# Patient Record
Sex: Female | Born: 1950 | Race: White | Hispanic: No | Marital: Married | Smoking: Former smoker
Health system: Southern US, Community
[De-identification: ages and names within clinical notes are randomized; demographics above are authoritative.]

## PROBLEM LIST (undated history)

## (undated) DIAGNOSIS — R490 Dysphonia: Secondary | ICD-10-CM

## (undated) DIAGNOSIS — I219 Acute myocardial infarction, unspecified: Secondary | ICD-10-CM

## (undated) DIAGNOSIS — I2510 Atherosclerotic heart disease of native coronary artery without angina pectoris: Secondary | ICD-10-CM

## (undated) DIAGNOSIS — M810 Age-related osteoporosis without current pathological fracture: Secondary | ICD-10-CM

## (undated) DIAGNOSIS — E782 Mixed hyperlipidemia: Secondary | ICD-10-CM

## (undated) DIAGNOSIS — J45909 Unspecified asthma, uncomplicated: Secondary | ICD-10-CM

## (undated) DIAGNOSIS — G709 Myoneural disorder, unspecified: Secondary | ICD-10-CM

## (undated) DIAGNOSIS — K219 Gastro-esophageal reflux disease without esophagitis: Secondary | ICD-10-CM

## (undated) DIAGNOSIS — G35 Multiple sclerosis: Secondary | ICD-10-CM

## (undated) DIAGNOSIS — I871 Compression of vein: Secondary | ICD-10-CM

## (undated) DIAGNOSIS — J449 Chronic obstructive pulmonary disease, unspecified: Secondary | ICD-10-CM

## (undated) DIAGNOSIS — M1990 Unspecified osteoarthritis, unspecified site: Secondary | ICD-10-CM

## (undated) DIAGNOSIS — E119 Type 2 diabetes mellitus without complications: Secondary | ICD-10-CM

## (undated) HISTORY — DX: Chronic obstructive pulmonary disease, unspecified: J44.9

## (undated) HISTORY — DX: Dysphonia: R49.0

## (undated) HISTORY — PX: BACK SURGERY: SHX140

## (undated) HISTORY — DX: Gastro-esophageal reflux disease without esophagitis: K21.9

## (undated) HISTORY — DX: Compression of vein: I87.1

## (undated) HISTORY — DX: Atherosclerotic heart disease of native coronary artery without angina pectoris: I25.10

## (undated) HISTORY — PX: TONSILLECTOMY: SUR1361

## (undated) HISTORY — PX: WRIST SURGERY: SHX841

## (undated) HISTORY — DX: Type 2 diabetes mellitus without complications: E11.9

## (undated) HISTORY — PX: FRACTURE SURGERY: SHX138

## (undated) HISTORY — DX: Acute myocardial infarction, unspecified: I21.9

## (undated) HISTORY — DX: Multiple sclerosis: G35

## (undated) HISTORY — DX: Mixed hyperlipidemia: E78.2

## (undated) HISTORY — PX: CHOLECYSTECTOMY: SHX55

## (undated) HISTORY — PX: ABDOMINAL HYSTERECTOMY: SHX81

## (undated) HISTORY — PX: CORONARY ANGIOPLASTY: SHX604

---

## 1999-06-15 ENCOUNTER — Inpatient Hospital Stay (HOSPITAL_COMMUNITY): Admission: EM | Admit: 1999-06-15 | Discharge: 1999-06-16 | Payer: Self-pay | Admitting: Cardiology

## 1999-07-01 ENCOUNTER — Encounter: Payer: Self-pay | Admitting: Oral Surgery

## 1999-07-08 ENCOUNTER — Ambulatory Visit (HOSPITAL_COMMUNITY): Admission: RE | Admit: 1999-07-08 | Discharge: 1999-07-08 | Payer: Self-pay | Admitting: Oral Surgery

## 2000-08-27 ENCOUNTER — Encounter: Admission: RE | Admit: 2000-08-27 | Discharge: 2000-08-27 | Payer: Self-pay

## 2001-01-13 ENCOUNTER — Other Ambulatory Visit: Admission: RE | Admit: 2001-01-13 | Discharge: 2001-01-13 | Payer: Self-pay | Admitting: Otolaryngology

## 2005-08-10 HISTORY — PX: COLONOSCOPY: SHX174

## 2006-08-10 HISTORY — PX: ESOPHAGOGASTRODUODENOSCOPY: SHX1529

## 2007-06-24 ENCOUNTER — Ambulatory Visit: Payer: Self-pay | Admitting: Internal Medicine

## 2007-06-30 ENCOUNTER — Ambulatory Visit (HOSPITAL_COMMUNITY): Admission: RE | Admit: 2007-06-30 | Discharge: 2007-06-30 | Payer: Self-pay | Admitting: Internal Medicine

## 2007-06-30 ENCOUNTER — Ambulatory Visit: Payer: Self-pay | Admitting: Internal Medicine

## 2007-07-05 ENCOUNTER — Ambulatory Visit (HOSPITAL_COMMUNITY): Admission: RE | Admit: 2007-07-05 | Discharge: 2007-07-05 | Payer: Self-pay | Admitting: Internal Medicine

## 2007-08-19 ENCOUNTER — Ambulatory Visit: Payer: Self-pay | Admitting: Internal Medicine

## 2007-12-17 ENCOUNTER — Ambulatory Visit: Payer: Self-pay | Admitting: *Deleted

## 2007-12-17 ENCOUNTER — Inpatient Hospital Stay (HOSPITAL_COMMUNITY): Admission: AD | Admit: 2007-12-17 | Discharge: 2007-12-22 | Payer: Self-pay | Admitting: Internal Medicine

## 2007-12-17 ENCOUNTER — Encounter: Payer: Self-pay | Admitting: Cardiology

## 2007-12-22 ENCOUNTER — Ambulatory Visit: Payer: Self-pay | Admitting: Vascular Surgery

## 2007-12-22 ENCOUNTER — Encounter: Payer: Self-pay | Admitting: Cardiology

## 2008-01-12 ENCOUNTER — Ambulatory Visit: Payer: Self-pay | Admitting: Cardiology

## 2008-04-10 ENCOUNTER — Encounter: Payer: Self-pay | Admitting: Cardiology

## 2008-05-11 ENCOUNTER — Encounter: Payer: Self-pay | Admitting: Cardiology

## 2009-04-29 DIAGNOSIS — E785 Hyperlipidemia, unspecified: Secondary | ICD-10-CM

## 2009-05-20 ENCOUNTER — Ambulatory Visit: Payer: Self-pay | Admitting: Cardiology

## 2009-05-20 ENCOUNTER — Encounter: Payer: Self-pay | Admitting: Physician Assistant

## 2009-05-20 DIAGNOSIS — R0789 Other chest pain: Secondary | ICD-10-CM | POA: Insufficient documentation

## 2009-05-20 DIAGNOSIS — I2510 Atherosclerotic heart disease of native coronary artery without angina pectoris: Secondary | ICD-10-CM

## 2009-05-20 DIAGNOSIS — R0989 Other specified symptoms and signs involving the circulatory and respiratory systems: Secondary | ICD-10-CM

## 2009-05-20 DIAGNOSIS — I739 Peripheral vascular disease, unspecified: Secondary | ICD-10-CM

## 2009-05-20 DIAGNOSIS — F17200 Nicotine dependence, unspecified, uncomplicated: Secondary | ICD-10-CM | POA: Insufficient documentation

## 2009-05-22 ENCOUNTER — Encounter: Payer: Self-pay | Admitting: Physician Assistant

## 2009-05-22 ENCOUNTER — Ambulatory Visit: Payer: Self-pay | Admitting: Cardiology

## 2009-06-25 ENCOUNTER — Ambulatory Visit: Payer: Self-pay | Admitting: Cardiology

## 2009-06-25 DIAGNOSIS — G458 Other transient cerebral ischemic attacks and related syndromes: Secondary | ICD-10-CM

## 2009-06-28 ENCOUNTER — Encounter: Payer: Self-pay | Admitting: Cardiology

## 2009-07-09 ENCOUNTER — Ambulatory Visit: Payer: Self-pay | Admitting: Cardiovascular Disease

## 2009-07-10 ENCOUNTER — Encounter: Payer: Self-pay | Admitting: Cardiology

## 2009-07-24 ENCOUNTER — Encounter: Payer: Self-pay | Admitting: Cardiology

## 2009-07-26 ENCOUNTER — Encounter (INDEPENDENT_AMBULATORY_CARE_PROVIDER_SITE_OTHER): Payer: Self-pay

## 2009-07-26 ENCOUNTER — Telehealth: Payer: Self-pay | Admitting: Cardiovascular Disease

## 2009-07-26 ENCOUNTER — Encounter: Payer: Self-pay | Admitting: Cardiovascular Disease

## 2009-08-01 ENCOUNTER — Ambulatory Visit (HOSPITAL_COMMUNITY): Admission: RE | Admit: 2009-08-01 | Discharge: 2009-08-01 | Payer: Self-pay | Admitting: Cardiovascular Disease

## 2009-08-01 ENCOUNTER — Ambulatory Visit: Payer: Self-pay | Admitting: Cardiovascular Disease

## 2009-09-03 ENCOUNTER — Ambulatory Visit: Payer: Self-pay | Admitting: Cardiovascular Disease

## 2009-09-03 ENCOUNTER — Other Ambulatory Visit: Admission: RE | Admit: 2009-09-03 | Discharge: 2009-09-03 | Payer: Self-pay | Admitting: Endocrinology

## 2009-10-10 ENCOUNTER — Telehealth (INDEPENDENT_AMBULATORY_CARE_PROVIDER_SITE_OTHER): Payer: Self-pay | Admitting: *Deleted

## 2009-10-29 ENCOUNTER — Ambulatory Visit: Payer: Self-pay | Admitting: Cardiology

## 2009-10-29 DIAGNOSIS — G4700 Insomnia, unspecified: Secondary | ICD-10-CM | POA: Insufficient documentation

## 2010-03-20 ENCOUNTER — Ambulatory Visit: Payer: Self-pay

## 2010-03-20 ENCOUNTER — Ambulatory Visit: Payer: Self-pay | Admitting: Cardiovascular Disease

## 2010-05-08 ENCOUNTER — Ambulatory Visit: Payer: Self-pay | Admitting: Cardiology

## 2010-05-13 ENCOUNTER — Encounter: Payer: Self-pay | Admitting: Cardiology

## 2010-09-10 HISTORY — PX: CORONARY ARTERY BYPASS GRAFT: SHX141

## 2010-09-11 NOTE — Assessment & Plan Note (Signed)
Summary: ROV   Visit Type:  Follow-up Primary Provider:  Kirstie Peri, MD   History of Present Illness: 60 year-old woman with CAD and left subclavian stenosis presenting for follow-up evaluation. She presented in December 2010 with left arm claudication and was found to have severe left subclavian stenosis by ultrasound and CTA. She underwent angio and stenting on December 23rd.   She has noticed increased strength and less feeling of coldness in the left arm. She denies dizziness, lightheadedness, syncope, or arm claudication at present.    Current Medications (verified): 1)  Metoprolol Tartrate 25 Mg Tabs (Metoprolol Tartrate) .... Take 1/2 Tablet By Mouth Twice A Day 2)  Plavix 75 Mg Tabs (Clopidogrel Bisulfate) .... Take 1 Tablet By Mouth Once A Day 3)  Simvastatin 20 Mg Tabs (Simvastatin) .... Take 1 Tablet By Mouth At Bedtime 4)  Wellbutrin Sr 150 Mg Xr12h-Tab (Bupropion Hcl) .... Take 1 Tablet By Mouth Two Times A Day 5)  Gabapentin 600 Mg Tabs (Gabapentin) .... Take 1 Tablet By Mouth Two Times A Day 6)  Aspirin 81 Mg Tbec (Aspirin) .... Take One Tablet By Mouth Daily 7)  Norvasc 5 Mg Tabs (Amlodipine Besylate) .... Take 1 Tablet By Mouth Once A Day 8)  Isosorbide Mononitrate Cr 30 Mg Xr24h-Tab (Isosorbide Mononitrate) .... Take 1 Tablet By Mouth Once A Day 9)  Trazodone Hcl 50 Mg Tabs (Trazodone Hcl) .... As Needed  Allergies: 1)  Ambien (Zolpidem Tartrate) 2)  Morphine Sulfate (Morphine Sulfate)  Past History:  Past medical history reviewed for relevance to current acute and chronic problems.  Past Medical History: HYPERLIPIDEMIA-MIXED (ICD-272.4) Hx of MI Multiple sclerosis.  Hoarseness, chronic.  Diet-controlled diabetes.  Dyslipidemia.  Intolerant of high-dose Lipitor.  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  History of gastroesophageal reflux disease. PAD with left subclavian stenosis and subclavian steal s/p subclavian stenting  Vital  Signs:  Patient profile:   60 year old female Height:      62 inches Weight:      110.25 pounds BMI:     20.24 Pulse rate:   64 / minute Pulse rhythm:   regular Resp:     18 per minute BP sitting:   94 / 60  (left arm) Cuff size:   regular  Vitals Entered By: Vikki Ports (March 20, 2010 11:03 AM)  Physical Exam  General:  Pt is alert and oriented, in no acute distress. HEENT: normal Neck: normal carotid upstrokes without bruits, JVP normal Lungs: CTA CV: RRR without murmur or gallop Abd: soft, NT, positive BS, no bruit, no organomegaly Ext: no clubbing, cyanosis, or edema. 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 Left arm arterial waveforms are brisk and triphasic. Carotid duplex reviewed and shows plaque that is not hemodynamically significant and vertebral flow is antegrade bilaterally. Continue curent therapy and f/u in one year.  Her updated medication list for this problem includes:    Plavix 75 Mg Tabs (Clopidogrel bisulfate) .Marland Kitchen... Take 1 tablet by mouth once a day    Aspirin 81 Mg Tbec (Aspirin) .Marland Kitchen... Take one tablet by mouth daily  Patient Instructions: 1)  Your physician recommends that you continue on your current medications as directed. Please refer to the Current Medication list given to you today. 2)  Your physician wants you to follow-up in: 1 YEAR.  You will receive a  reminder letter in the mail two months in advance. If you don't receive a letter, please call our office to schedule the follow-up appointment. 3)  Your physician has requested that you have a  upper extremity arterial duplex in 1 YEAR.  This test is an ultrasound of the arteries in the arms.  It looks at arterial blood flow in the arms.  Allow one hour for Upper Arterial scans. There are no restrictions or special instructions.

## 2010-09-11 NOTE — Cardiovascular Report (Signed)
Summary: Pre Op Orders  Pre Op Orders   Imported By: Roderic Ovens 08/26/2009 11:08:49  _____________________________________________________________________  External Attachment:    Type:   Image     Comment:   External Document

## 2010-09-11 NOTE — Assessment & Plan Note (Signed)
Summary: eval. chest pain  --agh   Visit Type:  Follow-up Primary Provider:  Kirstie Peri, MD  CC:  follow-up visit.  History of Present Illness: the patient is a 60 year old female with history of herniorrhaphies and prior myocardial infarction. She recently underwent stenting for symptomatic left subclavian artery stenosis. The patient currently denies exertional chest pain. She reports orthopnea PND. A stress test last year which was negative for ischemia. 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 She also has peripheral vascular disease with 50-70% bilateral carotid artery stenosis.  The patient under significant stress. She feels very depressed. She has a difficult social situation and is trying to get custody of her grandchildren. She has severe insomnia.  The patient denies any claudication. She reports no orthopnea or PND. She reports no palpitations.  Preventive Screening-Counseling & Management  Alcohol-Tobacco     Smoking Status: current     Smoking Cessation Counseling: yes     Packs/Day: 1/2 ppd  Current Medications (verified): 1)  Metoprolol Tartrate 25 Mg Tabs (Metoprolol Tartrate) .... Take 1/2 Tablet By Mouth Twice A Day 2)  Plavix 75 Mg Tabs (Clopidogrel Bisulfate) .... Take 1 Tablet By Mouth Once A Day 3)  Simvastatin 20 Mg Tabs (Simvastatin) .... Take 1 Tablet By Mouth At Bedtime 4)  Wellbutrin Sr 150 Mg Xr12h-Tab (Bupropion Hcl) .... Take 1 Tablet By Mouth Once A Day 5)  Gabapentin 600 Mg Tabs (Gabapentin) .... Take 1 Tablet By Mouth Two Times A Day 6)  Aspirin 81 Mg Tbec (Aspirin) .... Take One Tablet By Mouth Daily 7)  Norvasc 5 Mg Tabs (Amlodipine Besylate) .... Take 1 Tablet By Mouth Once A Day 8)  Isosorbide Mononitrate Cr 30 Mg Xr24h-Tab (Isosorbide Mononitrate) .... Take 1 Tablet By Mouth Once A Day 9)  Trazodone Hcl 50 Mg Tabs (Trazodone Hcl) .... Take 1 Tab By Mouth At Bedtime  Allergies (verified): 1)  Ambien (Zolpidem  Tartrate) 2)  Morphine Sulfate (Morphine Sulfate)  Comments:  Nurse/Medical Assistant: The patient's medications and allergies were reviewed with the patient and were updated in the Medication and Allergy Lists. List reviewed.  Past History:  Past Medical History: Last updated: 08/08/09 HYPERLIPIDEMIA-MIXED (ICD-272.4) Hx of MI Multiple sclerosis.  Hoarseness, chronic.  Diet-controlled diabetes.  Dyslipidemia.  Intolerant of high-dose Lipitor.  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  History of gastroesophageal reflux disease. 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  Past Surgical History: Last updated: 04/29/2009 Cholecystectomy  Family History: Last updated: 2009/08/08 Mother died at age 20 from cancer, father died at age 23 from cancer, sister died at age 53 from "blood poisoning"  No premature CAD or aneurysm in the family.  Social History: Last updated: 08/08/09 Disabled  Married with 2 children Tobacco Use - Yes.  Alcohol Use - yes Drug Use - no  Risk Factors: Smoking Status: current (10/29/2009) Packs/Day: 1/2 ppd (10/29/2009)  Social History: Packs/Day:  1/2 ppd  Review of Systems       The patient complains of depression.  The patient denies fatigue, malaise, fever, weight gain/loss, vision loss, decreased hearing, hoarseness, chest pain, palpitations, shortness of breath, prolonged cough, wheezing, sleep apnea, coughing up blood, abdominal pain, blood in stool, nausea, vomiting, diarrhea, heartburn, incontinence, blood in urine, muscle weakness, joint pain, leg swelling, rash, skin lesions, headache, fainting, dizziness, anxiety, enlarged lymph nodes, easy bruising or bleeding, and environmental allergies.         insomnia  Vital Signs:  Patient profile:  60 year old female Height:      62 inches Weight:      113 pounds Pulse rate:   73 / minute BP sitting:   129 / 84  (left arm) Cuff size:    regular  Vitals Entered By: Carlye Grippe (October 29, 2009 1:17 PM) CC: follow-up visit   Physical Exam  Additional Exam:  General: Well-developed, well-nourished in no distress head: Normocephalic and atraumatic eyes PERRLA/EOMI intact, conjunctiva and lids normal nose: No deformity or lesions mouth normal dentition, normal posterior pharynx neck: Supple, no JVD.  No masses, thyromegaly or abnormal cervical nodes lungs: Normal breath sounds bilaterally without wheezing.  Normal percussion heart: regular rate and rhythm with normal S1 and S2, no S3 or S4.  PMI is normal.  No pathological murmurs abdomen: Normal bowel sounds, abdomen is soft and nontender without masses, organomegaly or hernias noted.  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 She reports no dizziness. She has an increased sensation of warmth in her left arm. She has a good distal pulse. Her updated medication list for this problem includes:    Plavix 75 Mg Tabs (Clopidogrel bisulfate) .Marland Kitchen... Take 1 tablet by mouth once a day    Aspirin 81 Mg Tbec (Aspirin) .Marland Kitchen... Take one tablet by mouth daily  Problem # 2:  CORONARY ATHEROSCLEROSIS NATIVE CORONARY ARTERY (ICD-414.01) the patient denies exertional chest pain. Chest chest is last year which showed no ischemia. She can continue her current medical regimen. Her updated medication list for this problem includes:    Metoprolol Tartrate 25 Mg Tabs (Metoprolol tartrate) .Marland Kitchen... Take 1/2 tablet by mouth twice a day    Plavix 75 Mg Tabs  (Clopidogrel bisulfate) .Marland Kitchen... Take 1 tablet by mouth once a day    Aspirin 81 Mg Tbec (Aspirin) .Marland Kitchen... Take one tablet by mouth daily    Norvasc 5 Mg Tabs (Amlodipine besylate) .Marland Kitchen... Take 1 tablet by mouth once a day    Isosorbide Mononitrate Cr 30 Mg Xr24h-tab (Isosorbide mononitrate) .Marland Kitchen... Take 1 tablet by mouth once a day  Problem # 3:  PERIPHERAL VASCULAR DISEASE (ICD-443.9) the patient has bilateral carotid artery stenosis rated at 50-70%. She will be scheduled for followup Doppler to her next clinic visit  Problem # 4:  TOBACCO USER (ICD-305.1) the patient was counseled regarding tobacco use.  Problem # 5:  INSOMNIA (ICD-780.52) the patient has insomnia likely related to her depression. I asked her to cut back a little bit on Wellbutrin 150 mg in the morning and started her on trazodone in the evening at 50 mg p.o. q.h.s. This can be further increased to 75 mg if needed.  Other Orders: EKG w/ Interpretation (93000)  Patient Instructions: 1)  Trazodone 50mg  at bedtime  2)  Stop Benadryl at bedtime 3)  Plavix 75mg  - #12 tabs given (samples)  Lot [id number]  Exp. 10/2010 4)  Follow up in  6 months. Prescriptions: GABAPENTIN 600 MG TABS (GABAPENTIN) Take 1 tablet by mouth two times a day  #60 x 0   Entered by:   Hoover Brunette, LPN   Authorized by:  Lewayne Bunting, MD, [facility] [place]   Signed by:   Hoover Brunette, LPN on 16/05/9603   Method used:   Electronically to        [facility] [address] (retail)       [address]       [place], [place]  [address]       Ph: [phone] or [phone]       Fax: [phone]   RxID:   [id number] NORVASC 5 MG TABS (AMLODIPINE BESYLATE) Take 1 tablet by mouth once a day  #90 x 3   Entered by:   Hoover Brunette, LPN   Authorized by:   Lewayne Bunting, MD, [facility]   Signed by:   Hoover Brunette, LPN on 40/34/7425   Method used:   Electronically to        [facility] (mail-order)             ,          Ph: [phone]       Fax:  [phone]   RxID:   [id number] SIMVASTATIN 20 MG TABS (SIMVASTATIN) Take 1 tablet by mouth at bedtime  #90 x 3   Entered by:   Hoover Brunette, LPN   Authorized by:   Lewayne Bunting, MD, [facility]   Signed by:   Hoover Brunette, LPN on 73/22/0254   Method used:   Electronically to        [facility] (mail-order)             ,          Ph: [phone]       Fax: [phone]   RxID:   [id number] PLAVIX 75 MG TABS (CLOPIDOGREL BISULFATE) Take 1 tablet by mouth once a day  #90 x 3   Entered by:   Hoover Brunette, LPN   Authorized by:   Lewayne Bunting, MD, [facility]   Signed by:   Hoover Brunette, LPN on 35/00/9381   Method used:   Electronically to        [facility] (mail-order)             ,          Ph: [phone]       Fax: [phone]   RxID:   [id number] METOPROLOL TARTRATE 25 MG TABS (METOPROLOL TARTRATE) Take 1/2 tablet by mouth twice a day  #90 x 3   Entered by:   Hoover Brunette, LPN   Authorized by:   Lewayne Bunting, MD, [facility]   Signed by:   Hoover Brunette, LPN on 61/44/3154   Method used:   Electronically to        [facility] (mail-order)             ,          Ph: [phone]       Fax: [phone]   RxID:   [id number] WELLBUTRIN SR 150 MG XR12H-TAB (BUPROPION HCL) Take 1 tablet by mouth once a day  #30 x 0   Entered by:   Hoover Brunette, LPN   Authorized by:   Lewayne Bunting, MD, [facility]   Signed by:   Hoover Brunette, LPN on 34/19/3790   Method used:   Electronically to        [facility] [address] (retail)       [address]       [place]  [place], [place]  [address]       Ph: [phone] or [phone]       Fax: [phone]   RxID:   [id number] TRAZODONE HCL 50 MG TABS (TRAZODONE HCL) Take 1 tab by mouth at bedtime  #30 x 2   Entered by:   Hoover Brunette, LPN   Authorized by:   Lewayne Bunting, MD, [facility]   Signed by:   Hoover Brunette, LPN on 27/25/3664   Method used:   Electronically to        [facility] [address] (retail)       [address]       [place], [place]  [address]       Ph: [phone] or [phone]       Fax: [phone]   RxID:   [id number]   Handout requested.

## 2010-09-11 NOTE — Assessment & Plan Note (Signed)
Summary: 6 mo fu per sept reminder-srs   Visit Type:  Follow-up Primary Provider:  Kirstie Peri, MD   History of Present Illness: the patient is a 60 year old female with history of peripheral vascular disease status post stent placement of left subclavian artery. Prior Doppler demonstrated significant carotid disease but a repeat study in our vascular lab at [facility] showed only 0-39% bilateral stenosis. The patient continues on dual antiplatelet therapy. She also has a multinodular goiter which is being evaluated. She has increased her exercise level but unfortunately continues to smoke.  She denies any chest pain. She has no orthopnea PND. Her insomnia has improved. She reports no dizziness or syncope.  Preventive Screening-Counseling & Management  Alcohol-Tobacco     Smoking Status: current     Smoking Cessation Counseling: yes     Packs/Day: 1/2 PPD  Current Medications (verified): 1)  Metoprolol Tartrate 25 Mg Tabs (Metoprolol Tartrate) .... Take 1/2 Tablet By Mouth Twice A Day 2)  Plavix 75 Mg Tabs (Clopidogrel Bisulfate) .... Take 1 Tablet By Mouth Once A Day 3)  Simvastatin 20 Mg Tabs (Simvastatin) .... Take 1 Tablet By Mouth At Bedtime 4)  Wellbutrin Sr 150 Mg Xr12h-Tab (Bupropion Hcl) .... Take 1 Tablet By Mouth Two Times A Day 5)  Gabapentin 600 Mg Tabs (Gabapentin) .... Take 1 Tablet By Mouth Two Times A Day 6)  Aspirin 81 Mg Tbec (Aspirin) .... Take One Tablet By Mouth Daily 7)  Norvasc 5 Mg Tabs (Amlodipine Besylate) .... Take 1 Tablet By Mouth Once A Day 8)  Isosorbide Mononitrate Cr 30 Mg Xr24h-Tab (Isosorbide Mononitrate) .... Take 1 Tablet By Mouth Once A Day 9)  Trazodone Hcl 50 Mg Tabs (Trazodone Hcl) .... As Needed 10)  Nitrostat 0.4 Mg Subl (Nitroglycerin) .... Place 1 Under Tongue For Chest Pain Every 5 Min Up To 3 Doses,if No Relief,proceed To Ed 11)  Prilosec Otc 20 Mg Tbec (Omeprazole Magnesium) .... Take 1 Tablet By Mouth Two Times A Day 12)  Reclast 5  Mg/172ml Soln (Zoledronic Acid) .... One Dose Iv Yearly  Allergies (verified): 1)  Ambien (Zolpidem Tartrate) 2)  Morphine Sulfate (Morphine Sulfate)  Comments:  Nurse/Medical Assistant: The patient's medication list and allergies were reviewed with the patient and were updated in the Medication and Allergy Lists.  Past History:  Past Surgical History: Last updated: 04/29/2009 Cholecystectomy  Family History: Last updated: 2009/07/30 Mother died at age 69 from cancer, father died at age 42 from cancer, sister died at age 63 from "blood poisoning"  No premature CAD or aneurysm in the family.  Social History: Last updated: 07/30/09 Disabled  Married with 2 children Tobacco Use - Yes.  Alcohol Use - yes Drug Use - no  Risk Factors: Smoking Status: current (05/08/2010) Packs/Day: 1/2 PPD (05/08/2010)  Past Medical History: HYPERLIPIDEMIA-MIXED (ICD-272.4) Hx of MI, 2009, status post stent placement x2 Multiple sclerosis.  Hoarseness, chronic.  Diet-controlled diabetes.  Dyslipidemia.  Intolerant of high-dose Lipitor.  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  History of gastroesophageal reflux disease. 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  No masses, thyromegaly or abnormal cervical nodes, soft carotid bruits lungs: Normal breath  sounds bilaterally without wheezing.  Normal percussion heart: regular rate and rhythm with normal S1 and S2, no S3 or S4.  PMI is normal.  No pathological murmurs abdomen: Normal bowel sounds,  abdomen is soft and nontender without masses, organomegaly or hernias noted.  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 Dopplers were performed with a patent stent on March 20, 2010. Continue risk factor modification Her updated medication list for this problem includes:    Plavix 75 Mg Tabs (Clopidogrel bisulfate) .Marland Kitchen... Take 1 tablet by mouth once a day    Aspirin 81 Mg Tbec (Aspirin) .Marland Kitchen... Take one tablet by mouth daily  Problem # 2:  CORONARY ATHEROSCLEROSIS NATIVE CORONARY ARTERY (ICD-414.01) no recurrent chest pain. The patient and my 2009 and stent placement that same year no indication for stress testing currently Her updated medication list for this problem includes:    Metoprolol Tartrate 25 Mg Tabs (Metoprolol tartrate) .Marland Kitchen... Take 1/2 tablet by mouth twice a day    Plavix 75 Mg Tabs (Clopidogrel bisulfate) .Marland Kitchen... Take 1 tablet by mouth once a day    Aspirin 81 Mg Tbec (Aspirin) .Marland Kitchen... Take one tablet by mouth daily    Norvasc 5 Mg Tabs (Amlodipine besylate) .Marland Kitchen... Take 1 tablet by mouth once a day    Isosorbide Mononitrate Cr 30 Mg Xr24h-tab (Isosorbide mononitrate) .Marland Kitchen... Take 1 tablet by mouth once a day    Nitrostat 0.4 Mg Subl (Nitroglycerin) .Marland Kitchen... Place 1 under tongue for chest pain every 5 min up to 3 doses,if no relief,proceed to ed  Problem # 3:  CAROTID BRUIT (ICD-785.9) no significant carotid artery disease  Problem # 4:  HYPERLIPIDEMIA-MIXED (ICD-272.4) patient will follow up with her primary care physician Her updated medication list for this  problem includes:    Simvastatin 20 Mg Tabs (Simvastatin) .Marland Kitchen... Take 1 tablet by mouth at bedtime   Social History: Packs/Day:  1/2 PPD  Review of Systems  The patient denies fatigue, malaise, fever, weight gain/loss, vision loss, decreased hearing, hoarseness, chest pain, palpitations, shortness of breath, prolonged cough, wheezing, sleep apnea, coughing up blood, abdominal pain, blood in stool, nausea, vomiting, diarrhea, heartburn, incontinence, blood in urine, muscle weakness, joint pain, leg swelling, rash, skin lesions, headache, fainting, dizziness, depression, anxiety, enlarged lymph nodes, easy bruising or bleeding, and environmental allergies.    Vital Signs:  Patient profile:   60 year old female Height:      62 inches Weight:      111 pounds Pulse rate:   64 / minute BP sitting:   127 / 84  (left arm) Cuff size:   regular  Vitals Entered By: Carlye Grippe (May 08, 2010 10:50 AM)  Physical Exam  Additional Exam:  General: Well-developed, well-nourished in no distress head: Normocephalic and atraumatic eyes PERRLA/EOMI intact, conjunctiva and lids normal nose: No deformity or lesions mouth normal dentition, normal posterior pharynx neck: Supple, no JVD.  No masses, thyromegaly or abnormal cervical nodes, soft carotid bruits lungs: Normal breath sounds bilaterally without wheezing.  Normal percussion heart: regular rate and rhythm with normal S1 and S2, no S3 or S4.  PMI is  normal.  No pathological murmurs abdomen: Normal bowel sounds, abdomen is soft and nontender without masses, organomegaly or hernias noted.  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 Dopplers were performed with a patent stent on March 20, 2010. Continue risk factor modification Her updated medication list for this problem includes:    Plavix 75 Mg Tabs (Clopidogrel bisulfate) .Marland Kitchen... Take 1 tablet by mouth once a day    Aspirin 81 Mg Tbec (Aspirin) .Marland Kitchen... Take one tablet by mouth daily  Problem # 2:  CORONARY ATHEROSCLEROSIS NATIVE CORONARY ARTERY (ICD-414.01) no recurrent chest pain. The patient and my 2009 and stent placement that same year no indication for stress testing currently Her updated medication list for this problem includes:    Metoprolol Tartrate 25 Mg Tabs (Metoprolol tartrate) .Marland Kitchen... Take 1/2 tablet by mouth twice a day    Plavix 75 Mg Tabs (Clopidogrel bisulfate) .Marland Kitchen... Take 1 tablet by mouth once a day    Aspirin 81 Mg Tbec (Aspirin) .Marland Kitchen... Take one tablet by mouth daily    Norvasc 5 Mg Tabs (Amlodipine besylate) .Marland Kitchen... Take 1 tablet by mouth once a day    Isosorbide Mononitrate Cr 30 Mg Xr24h-tab (Isosorbide mononitrate) .Marland Kitchen... Take 1 tablet by mouth once a day    Nitrostat 0.4 Mg Subl (Nitroglycerin) .Marland Kitchen... Place 1 under tongue for chest pain every 5 min up to 3 doses,if no relief,proceed to ed  Problem # 3:  CAROTID BRUIT (ICD-785.9) no significant carotid artery disease  Problem # 4:  HYPERLIPIDEMIA-MIXED (ICD-272.4) patient will follow up with her primary care physician Her updated medication list for this problem includes:    Simvastatin 20 Mg Tabs (Simvastatin) .Marland Kitchen... Take 1 tablet by mouth at bedtime  Patient Instructions: 1)  Your physician recommends that you continue on your current medications as directed. Please refer to the Current Medication list given to you today. 2)  Follow up in  6 months Prescriptions: TRAZODONE HCL 50 MG TABS (TRAZODONE HCL) as needed  #30 x 2   Entered by:   Hoover Brunette, LPN   Authorized by:    Lewayne Bunting, MD, [facility]   Signed by:   Hoover Brunette, LPN on 19/14/7829   Method used:   Electronically to        [facility] [address] (retail)       [address]       [place], [place]  [address]       Ph: [phone] or [phone]       Fax: [phone]   RxID:   [id number] NITROSTAT 0.4 MG SUBL (NITROGLYCERIN) place 1 under tongue for chest pain every 5 min up to 3 doses,if no relief,proceed to ED  #25 x 3   Entered by:   Hoover Brunette, LPN   Authorized by:   Lewayne Bunting, MD, [facility]   Signed by:   Hoover Brunette, LPN on 56/43/3295   Method used:   Electronically to        [facility] [address] (retail)       [address]  [place], [place]  [address]       Ph: [phone] or [phone]       Fax: [phone]   RxID:   [id number]

## 2010-09-11 NOTE — Assessment & Plan Note (Signed)
Summary: rov   Visit Type:  Follow-up Primary Provider:  Kirstie Peri, MD  CC:  No complaints.  History of Present Illness: 60 year-old woman with CAD and left subclavian stenosis presenting for follow-up evaluation. She presented in December 2010 with left arm claudication and was found to have severe left subclavian stenosis by ultrasound and CTA. She underwent angio and stenting on December 23rd. The subclavian was a total occlusion, but the occluded segment was short. She reports complete resolution of left arm pain since the procedure. She had about one week of left hand swelling, which has now fully resolved. No chest pain or other complaints.  Current Medications (verified): 1)  Metoprolol Tartrate 25 Mg Tabs (Metoprolol Tartrate) .... Take 1/2 Tablet By Mouth Twice A Day 2)  Plavix 75 Mg Tabs (Clopidogrel Bisulfate) .... Take 1 Tablet By Mouth Once A Day 3)  Simvastatin 20 Mg Tabs (Simvastatin) .... Take 1 Tablet By Mouth At Bedtime 4)  Wellbutrin Sr 150 Mg Xr12h-Tab (Bupropion Hcl) .... Take 1 Tablet By Mouth Two Times A Day 5)  Gabapentin 600 Mg Tabs (Gabapentin) .... Take 1 Tablet By Mouth Two Times A Day 6)  Aspirin Ec 325 Mg Tbec (Aspirin) .... Take One Tablet By Mouth Daily 7)  Melatonin 3 Mg Tabs (Melatonin) .... Take 1 Tab By Mouth At Bedtime 8)  Darvocet-N 100 100-650 Mg Tabs (Propoxyphene N-Apap) .... As Needed Pain 9)  Norvasc 5 Mg Tabs (Amlodipine Besylate) .... Take 1 Tablet By Mouth Once A Day 10)  Benadryl 25 Mg Caps (Diphenhydramine Hcl) .... Take 1 Capsule By Mouth At Bedtime  Allergies: 1)  Ambien (Zolpidem Tartrate) 2)  Morphine Sulfate (Morphine Sulfate)  Past History:  Past medical history reviewed for relevance to current acute and chronic problems.  Past Medical History: Reviewed history from 07/09/2009 and no changes required. HYPERLIPIDEMIA-MIXED (ICD-272.4) Hx of MI Multiple sclerosis.  Hoarseness, chronic.  Diet-controlled diabetes.    Dyslipidemia.  Intolerant of high-dose Lipitor.  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  History of gastroesophageal reflux disease. 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  Vital Signs:  Patient profile:   60 year old female Height:      62 inches Weight:      115.50 pounds BMI:     21.20 Pulse rate:   72 / minute Pulse rhythm:   regular Resp:     18 per minute BP sitting:   116 / 70  (left arm)  Vitals Entered By: Vikki Ports (September 03, 2009 11:44 AM)  Serial Vital Signs/Assessments:  Time      Position  BP       Pulse  Resp  Temp     By           R Arm     106/72                         Vikki Ports   Physical Exam  General:  Pt is alert and oriented, in no acute distress. HEENT: normal Neck: normal carotid upstrokes without bruits, JVP normal Lungs: CTA CV: RRR without murmur or gallop Abd: soft, NT, positive BS, no bruit, no organomegaly Ext: no clubbing, cyanosis, or edema. 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 Brachial and radial pulses are 2+ bilaterally. Recommend f/u in 6 months  with carotid/upper extremity doppler studies at that time. She also has coronary stents, and I have recommended continuing ASA and Plavix, but reducing her ASA dose to 81 mg. She will f/u with Dr Andee Lineman for ongoing cardiac care.  Her updated medication list for this problem includes:    Plavix 75 Mg Tabs (Clopidogrel bisulfate) .Marland Kitchen... Take 1 tablet by mouth once a day    Aspirin 81 Mg Tbec (Aspirin) .Marland Kitchen... Take one tablet by mouth daily  Orders: Carotid Duplex (Carotid Duplex) Arterial Duplex Upper Extremity (Arterial Duplex Up )  Problem # 2:  CORONARY ATHEROSCLEROSIS NATIVE CORONARY ARTERY (ICD-414.01) Stable without angina. Reduce ASA to 81 mg as above.  Her updated medication list for  this problem includes:    Metoprolol Tartrate 25 Mg Tabs (Metoprolol tartrate) .Marland Kitchen... Take 1/2 tablet by mouth twice a day    Plavix 75 Mg Tabs (Clopidogrel bisulfate) .Marland Kitchen... Take 1 tablet by mouth once a day    Aspirin 81 Mg Tbec (Aspirin) .Marland Kitchen... Take one tablet by mouth daily    Norvasc 5 Mg Tabs (Amlodipine besylate) .Marland Kitchen... Take 1 tablet by mouth once a day  Orders: Carotid Duplex (Carotid Duplex) Arterial Duplex Upper Extremity (Arterial Duplex Up )  Patient Instructions: 1)  Your physician wants you to follow-up in:   6 MONTHS. You will receive a reminder letter in the mail two months in advance. If you don't receive a letter, please call our office to schedule the follow-up appointment. 2)  Your physician has requested that you have a carotid duplex in 6 MONTHS. This test is an ultrasound of the carotid arteries in your neck. It looks at blood flow through these arteries that supply the brain with blood. Allow one hour for this exam. There are no restrictions or special instructions. 3)  Your physician has requested that you have a upper extremity arterial duplex in 6 MONTHS.  This test is an ultrasound of the arteries in the arms.  It looks at arterial blood flow in the arms.  Allow one hour for Upper Arterial scans. There are no restrictions or special instructions. 4)  Your physician has recommended you make the following change in your medication: DECREASE Aspirin to 81mg  once a day

## 2010-09-11 NOTE — Progress Notes (Signed)
Summary: chest pain  Phone Note Call from Patient   Summary of Call: 9:05  Jan Irion [birth year]  [phone]  [phone]  Been having CP since Sat. off/on.  Not sure what to do.    Returned call to pt.  States that she has been under alot of stress lately and not sleeping good either.  States her chest pain has been off/on and is releived by ASA.  Has not had to take her NTG.  Does not have any other symptoms.  Scheduled appt. for 3/22.  Advised pt. to go to ED if symptoms continue/worsen.  Patient verbalized understanding.  Initial call taken by: Hoover Brunette, LPN,  October 10, 2009 3:56 PM  Follow-up for Phone Call        Would start Imdur 30mg  by mouth qdaily until she sees me in office.  Follow-up by: Lewayne Bunting, MD, [facility],  October 11, 2009 11:56 AM  Additional Follow-up for Phone Call Additional follow up Details #1::        Patient notified.    Additional Follow-up by: Hoover Brunette, LPN,  October 11, 2009 6:03 PM    New/Updated Medications: ISOSORBIDE MONONITRATE CR 30 MG XR24H-TAB (ISOSORBIDE MONONITRATE) Take 1 tablet by mouth once a day Prescriptions: ISOSORBIDE MONONITRATE CR 30 MG XR24H-TAB (ISOSORBIDE MONONITRATE) Take 1 tablet by mouth once a day  #30 x 6   Entered by:   Hoover Brunette, LPN   Authorized by:   Lewayne Bunting, MD, [facility]   Signed by:   Hoover Brunette, LPN on 30/86/5784   Method used:   Electronically to        [facility] [address] (retail)       [address]       [place], [place]  [address]       Ph: [phone] or [phone]       Fax: [phone]   RxID:   [id number]

## 2010-09-11 NOTE — Medication Information (Signed)
Summary: RX Folder/ FAXED [employer] TESTING AUTHORIZATION  RX Folder/ FAXED [employer]ING AUTHORIZATION   Imported By: Dorise Hiss 05/14/2010 12:29:21  _____________________________________________________________________  External Attachment:    Type:   Image     Comment:   External Document

## 2010-09-20 ENCOUNTER — Encounter: Payer: Self-pay | Admitting: Cardiology

## 2010-09-21 ENCOUNTER — Inpatient Hospital Stay (HOSPITAL_COMMUNITY)
Admission: RE | Admit: 2010-09-21 | Discharge: 2010-10-02 | DRG: 234 | Disposition: A | Discharge status: Home / Self Care | Payer: Medicare HMO | Source: Other Acute Inpatient Hospital | Attending: Thoracic Surgery (Cardiothoracic Vascular Surgery) | Admitting: Thoracic Surgery (Cardiothoracic Vascular Surgery)

## 2010-09-21 DIAGNOSIS — I214 Non-ST elevation (NSTEMI) myocardial infarction: Principal | ICD-10-CM | POA: Diagnosis present

## 2010-09-21 DIAGNOSIS — G35 Multiple sclerosis: Secondary | ICD-10-CM | POA: Diagnosis present

## 2010-09-21 DIAGNOSIS — I498 Other specified cardiac arrhythmias: Secondary | ICD-10-CM | POA: Diagnosis present

## 2010-09-21 DIAGNOSIS — I2510 Atherosclerotic heart disease of native coronary artery without angina pectoris: Secondary | ICD-10-CM | POA: Diagnosis present

## 2010-09-21 DIAGNOSIS — D696 Thrombocytopenia, unspecified: Secondary | ICD-10-CM | POA: Diagnosis present

## 2010-09-21 DIAGNOSIS — K219 Gastro-esophageal reflux disease without esophagitis: Secondary | ICD-10-CM | POA: Diagnosis present

## 2010-09-21 DIAGNOSIS — F3289 Other specified depressive episodes: Secondary | ICD-10-CM | POA: Diagnosis present

## 2010-09-21 DIAGNOSIS — R079 Chest pain, unspecified: Secondary | ICD-10-CM

## 2010-09-21 DIAGNOSIS — J449 Chronic obstructive pulmonary disease, unspecified: Secondary | ICD-10-CM | POA: Diagnosis present

## 2010-09-21 DIAGNOSIS — E785 Hyperlipidemia, unspecified: Secondary | ICD-10-CM | POA: Diagnosis present

## 2010-09-21 DIAGNOSIS — F329 Major depressive disorder, single episode, unspecified: Secondary | ICD-10-CM | POA: Diagnosis present

## 2010-09-21 DIAGNOSIS — Z9861 Coronary angioplasty status: Secondary | ICD-10-CM

## 2010-09-21 DIAGNOSIS — J4489 Other specified chronic obstructive pulmonary disease: Secondary | ICD-10-CM | POA: Diagnosis present

## 2010-09-21 DIAGNOSIS — I252 Old myocardial infarction: Secondary | ICD-10-CM

## 2010-09-21 DIAGNOSIS — Z7902 Long term (current) use of antithrombotics/antiplatelets: Secondary | ICD-10-CM

## 2010-09-21 DIAGNOSIS — Z7982 Long term (current) use of aspirin: Secondary | ICD-10-CM

## 2010-09-21 DIAGNOSIS — E119 Type 2 diabetes mellitus without complications: Secondary | ICD-10-CM | POA: Diagnosis present

## 2010-09-21 LAB — BRAIN NATRIURETIC PEPTIDE: Pro B Natriuretic peptide (BNP): 71 pg/mL (ref 0.0–100.0)

## 2010-09-21 LAB — CARDIAC PANEL(CRET KIN+CKTOT+MB+TROPI)
CK, MB: 8.7 ng/mL (ref 0.3–4.0)
CK, MB: 8.2 ng/mL (ref 0.3–4.0)
CK, MB: 6 ng/mL — ABNORMAL HIGH (ref 0.3–4.0)
Relative Index: 7.4 — ABNORMAL HIGH (ref 0.0–2.5)
Relative Index: 7.7 — ABNORMAL HIGH (ref 0.0–2.5)
Relative Index: INVALID (ref 0.0–2.5)
Total CK: 118 U/L (ref 7–177)
Total CK: 106 U/L (ref 7–177)
Total CK: 97 U/L (ref 7–177)
Troponin I: 1.27 ng/mL (ref 0.00–0.06)
Troponin I: 1.41 ng/mL (ref 0.00–0.06)
Troponin I: 1.2 ng/mL (ref 0.00–0.06)

## 2010-09-21 LAB — HEMOGLOBIN A1C
Hgb A1c MFr Bld: 5.8 % — ABNORMAL HIGH (ref <5.7)
Mean Plasma Glucose: 120 mg/dL — ABNORMAL HIGH (ref <117)

## 2010-09-21 LAB — BASIC METABOLIC PANEL
BUN: 5 mg/dL — ABNORMAL LOW (ref 6–23)
CO2: 29 mEq/L (ref 19–32)
Calcium: 9.5 mg/dL (ref 8.4–10.5)
Chloride: 109 mEq/L (ref 96–112)
Creatinine, Ser: 0.72 mg/dL (ref 0.4–1.2)
GFR calc Af Amer: >60 mL/min (ref >60)
GFR calc non Af Amer: >60 mL/min (ref >60)
Glucose, Bld: 137 mg/dL — ABNORMAL HIGH (ref 70–99)
Potassium: 3.9 mEq/L (ref 3.5–5.1)
Sodium: 143 mEq/L (ref 135–145)

## 2010-09-21 LAB — LIPID PANEL
Cholesterol: 162 mg/dL (ref 0–200)
HDL: 45 mg/dL (ref >39)
LDL Cholesterol: 105 mg/dL — ABNORMAL HIGH (ref 0–99)
Total CHOL/HDL Ratio: 3.6 RATIO
Triglycerides: 61 mg/dL (ref <150)
VLDL: 12 mg/dL (ref 0–40)

## 2010-09-21 LAB — TSH: TSH: 0.835 u[IU]/mL (ref 0.350–4.500)

## 2010-09-21 LAB — HEPARIN LEVEL (UNFRACTIONATED)
Heparin Unfractionated: 0.23 IU/mL — ABNORMAL LOW (ref 0.30–0.70)
Heparin Unfractionated: 0.39 IU/mL (ref 0.30–0.70)

## 2010-09-21 LAB — CBC
HCT: 34.3 % — ABNORMAL LOW (ref 36.0–46.0)
Hemoglobin: 11.4 g/dL — ABNORMAL LOW (ref 12.0–15.0)
MCH: 26.8 pg (ref 26.0–34.0)
MCHC: 33.2 g/dL (ref 30.0–36.0)
MCV: 80.5 fL (ref 78.0–100.0)
Platelets: 220 10*3/uL (ref 150–400)
RBC: 4.26 MIL/uL (ref 3.87–5.11)
RDW: 14.5 % (ref 11.5–15.5)
WBC: 7.4 10*3/uL (ref 4.0–10.5)

## 2010-09-21 LAB — GLUCOSE, CAPILLARY: Glucose-Capillary: 104 mg/dL — ABNORMAL HIGH (ref 70–99)

## 2010-09-22 DIAGNOSIS — I2510 Atherosclerotic heart disease of native coronary artery without angina pectoris: Secondary | ICD-10-CM

## 2010-09-22 LAB — CK TOTAL AND CKMB (NOT AT ARMC)
CK, MB: 3.7 ng/mL (ref 0.3–4.0)
Total CK: 75 U/L (ref 7–177)

## 2010-09-22 LAB — CBC
HCT: 34.3 % — ABNORMAL LOW (ref 36.0–46.0)
Hemoglobin: 11.4 g/dL — ABNORMAL LOW (ref 12.0–15.0)
WBC: 6.5 10*3/uL (ref 4.0–10.5)

## 2010-09-22 LAB — BASIC METABOLIC PANEL
CO2: 26 mEq/L (ref 19–32)
Chloride: 109 mEq/L (ref 96–112)
GFR calc Af Amer: >60 mL/min (ref >60)
Potassium: 3.9 mEq/L (ref 3.5–5.1)
Sodium: 142 mEq/L (ref 135–145)

## 2010-09-22 LAB — TROPONIN I: Troponin I: 0.89 ng/mL (ref 0.00–0.06)

## 2010-09-22 LAB — PROTIME-INR
INR: 0.94 (ref 0.00–1.49)
Prothrombin Time: 12.8 seconds (ref 11.6–15.2)

## 2010-09-23 ENCOUNTER — Other Ambulatory Visit (HOSPITAL_COMMUNITY): Payer: 59

## 2010-09-23 ENCOUNTER — Encounter: Payer: Self-pay | Admitting: Cardiology

## 2010-09-23 DIAGNOSIS — I2510 Atherosclerotic heart disease of native coronary artery without angina pectoris: Secondary | ICD-10-CM

## 2010-09-23 DIAGNOSIS — I059 Rheumatic mitral valve disease, unspecified: Secondary | ICD-10-CM

## 2010-09-23 LAB — CARDIAC PANEL(CRET KIN+CKTOT+MB+TROPI)
CK, MB: 1.3 ng/mL (ref 0.3–4.0)
Relative Index: INVALID (ref 0.0–2.5)
Total CK: 45 U/L (ref 7–177)
Troponin I: 0.41 ng/mL — ABNORMAL HIGH (ref 0.00–0.06)

## 2010-09-23 LAB — COMPREHENSIVE METABOLIC PANEL
ALT: 37 U/L — ABNORMAL HIGH (ref 0–35)
AST: 44 U/L — ABNORMAL HIGH (ref 0–37)
Albumin: 3.6 g/dL (ref 3.5–5.2)
Alkaline Phosphatase: 81 U/L (ref 39–117)
Calcium: 9.4 mg/dL (ref 8.4–10.5)
GFR calc Af Amer: >60 mL/min (ref >60)
Potassium: 4.3 mEq/L (ref 3.5–5.1)
Sodium: 140 mEq/L (ref 135–145)
Total Protein: 6.7 g/dL (ref 6.0–8.3)

## 2010-09-23 LAB — CBC
HCT: 34.4 % — ABNORMAL LOW (ref 36.0–46.0)
MCHC: 32.8 g/dL (ref 30.0–36.0)
RDW: 14.6 % (ref 11.5–15.5)

## 2010-09-23 LAB — BASIC METABOLIC PANEL
CO2: 24 mEq/L (ref 19–32)
Calcium: 9 mg/dL (ref 8.4–10.5)
Glucose, Bld: 100 mg/dL — ABNORMAL HIGH (ref 70–99)
Sodium: 139 mEq/L (ref 135–145)

## 2010-09-23 LAB — PLATELET INHIBITION P2Y12: P2Y12 % Inhibition: 9 %

## 2010-09-23 LAB — HEPARIN LEVEL (UNFRACTIONATED): Heparin Unfractionated: <0.1 IU/mL — ABNORMAL LOW (ref 0.30–0.70)

## 2010-09-24 ENCOUNTER — Inpatient Hospital Stay (HOSPITAL_COMMUNITY): Payer: Medicare HMO

## 2010-09-24 DIAGNOSIS — I2510 Atherosclerotic heart disease of native coronary artery without angina pectoris: Secondary | ICD-10-CM

## 2010-09-24 DIAGNOSIS — Z01810 Encounter for preprocedural cardiovascular examination: Secondary | ICD-10-CM

## 2010-09-24 LAB — CBC
HCT: 33.3 % — ABNORMAL LOW (ref 36.0–46.0)
MCH: 26.5 pg (ref 26.0–34.0)
MCV: 81 fL (ref 78.0–100.0)
RDW: 14.6 % (ref 11.5–15.5)
WBC: 6.6 10*3/uL (ref 4.0–10.5)

## 2010-09-24 LAB — URINALYSIS, ROUTINE W REFLEX MICROSCOPIC
Ketones, ur: NEGATIVE mg/dL
Nitrite: NEGATIVE
Protein, ur: NEGATIVE mg/dL
Urine Glucose, Fasting: NEGATIVE mg/dL
Urobilinogen, UA: 1 mg/dL (ref 0.0–1.0)

## 2010-09-24 LAB — BASIC METABOLIC PANEL
BUN: 6 mg/dL (ref 6–23)
CO2: 27 mEq/L (ref 19–32)
Calcium: 9.4 mg/dL (ref 8.4–10.5)
Glucose, Bld: 113 mg/dL — ABNORMAL HIGH (ref 70–99)
Sodium: 139 mEq/L (ref 135–145)

## 2010-09-25 LAB — DIFFERENTIAL
Lymphocytes Relative: 32 % (ref 12–46)
Monocytes Absolute: 0.6 10*3/uL (ref 0.1–1.0)
Monocytes Relative: 9 % (ref 3–12)
Neutro Abs: 3.6 10*3/uL (ref 1.7–7.7)

## 2010-09-25 LAB — CBC
HCT: 33.3 % — ABNORMAL LOW (ref 36.0–46.0)
Hemoglobin: 11 g/dL — ABNORMAL LOW (ref 12.0–15.0)
MCH: 26.8 pg (ref 26.0–34.0)
MCHC: 33 g/dL (ref 30.0–36.0)

## 2010-09-26 ENCOUNTER — Inpatient Hospital Stay (HOSPITAL_COMMUNITY): Payer: Medicare HMO

## 2010-09-26 ENCOUNTER — Encounter: Payer: Self-pay | Admitting: Thoracic Surgery (Cardiothoracic Vascular Surgery)

## 2010-09-26 DIAGNOSIS — I2510 Atherosclerotic heart disease of native coronary artery without angina pectoris: Secondary | ICD-10-CM

## 2010-09-26 LAB — BLOOD GAS, ARTERIAL
Acid-Base Excess: 0.6 mmol/L (ref 0.0–2.0)
Bicarbonate: 24.4 mEq/L — ABNORMAL HIGH (ref 20.0–24.0)
TCO2: 25.6 mmol/L (ref 0–100)
pCO2 arterial: 37.8 mmHg (ref 35.0–45.0)
pO2, Arterial: 76.1 mmHg — ABNORMAL LOW (ref 80.0–100.0)

## 2010-09-26 LAB — PLATELET COUNT: Platelets: 95 10*3/uL — ABNORMAL LOW (ref 150–400)

## 2010-09-26 LAB — POCT I-STAT 3, ART BLOOD GAS (G3+)
Acid-base deficit: 3 mmol/L — ABNORMAL HIGH (ref 0.0–2.0)
Acid-base deficit: 4 mmol/L — ABNORMAL HIGH (ref 0.0–2.0)
Bicarbonate: 22 mEq/L (ref 20.0–24.0)
O2 Saturation: 96 %
O2 Saturation: 97 %
TCO2: 23 mmol/L (ref 0–100)
pCO2 arterial: 47 mmHg — ABNORMAL HIGH (ref 35.0–45.0)
pO2, Arterial: 120 mmHg — ABNORMAL HIGH (ref 80.0–100.0)
pO2, Arterial: 96 mmHg (ref 80.0–100.0)
pO2, Arterial: 99 mmHg (ref 80.0–100.0)

## 2010-09-26 LAB — POCT I-STAT, CHEM 8
BUN: 4 mg/dL — ABNORMAL LOW (ref 6–23)
Calcium, Ion: 1.3 mmol/L (ref 1.12–1.32)
Chloride: 108 mEq/L (ref 96–112)
Creatinine, Ser: 0.7 mg/dL (ref 0.4–1.2)
Glucose, Bld: 141 mg/dL — ABNORMAL HIGH (ref 70–99)

## 2010-09-26 LAB — BASIC METABOLIC PANEL
CO2: 27 mEq/L (ref 19–32)
Calcium: 9.9 mg/dL (ref 8.4–10.5)
Chloride: 107 mEq/L (ref 96–112)
GFR calc Af Amer: >60 mL/min (ref >60)
Sodium: 142 mEq/L (ref 135–145)

## 2010-09-26 LAB — CREATININE, SERUM: GFR calc non Af Amer: >60 mL/min (ref >60)

## 2010-09-26 LAB — CBC
HCT: 31.4 % — ABNORMAL LOW (ref 36.0–46.0)
HCT: 33.8 % — ABNORMAL LOW (ref 36.0–46.0)
Hemoglobin: 11.2 g/dL — ABNORMAL LOW (ref 12.0–15.0)
MCH: 28 pg (ref 26.0–34.0)
MCH: 27.8 pg (ref 26.0–34.0)
MCHC: 32.7 g/dL (ref 30.0–36.0)
MCHC: 33.8 g/dL (ref 30.0–36.0)
MCV: 83.1 fL (ref 78.0–100.0)
MCV: 83.3 fL (ref 78.0–100.0)
Platelets: 94 10*3/uL — ABNORMAL LOW (ref 150–400)
RBC: 4.22 MIL/uL (ref 3.87–5.11)
RDW: 14.5 % (ref 11.5–15.5)
RDW: 14.7 % (ref 11.5–15.5)
WBC: 5.3 10*3/uL (ref 4.0–10.5)

## 2010-09-26 LAB — GLUCOSE, CAPILLARY

## 2010-09-26 LAB — POCT I-STAT 4, (NA,K, GLUC, HGB,HCT): Glucose, Bld: 109 mg/dL — ABNORMAL HIGH (ref 70–99)

## 2010-09-26 LAB — HEMOGLOBIN AND HEMATOCRIT, BLOOD: HCT: 25.8 % — ABNORMAL LOW (ref 36.0–46.0)

## 2010-09-26 LAB — SURGICAL PCR SCREEN
MRSA, PCR: NEGATIVE
Staphylococcus aureus: NEGATIVE

## 2010-09-27 ENCOUNTER — Encounter: Payer: Self-pay | Admitting: Cardiology

## 2010-09-27 ENCOUNTER — Inpatient Hospital Stay (HOSPITAL_COMMUNITY): Payer: Medicare HMO

## 2010-09-27 DIAGNOSIS — I200 Unstable angina: Secondary | ICD-10-CM

## 2010-09-27 LAB — CBC
HCT: 34.1 % — ABNORMAL LOW (ref 36.0–46.0)
HCT: 35.1 % — ABNORMAL LOW (ref 36.0–46.0)
Hemoglobin: 11.4 g/dL — ABNORMAL LOW (ref 12.0–15.0)
MCH: 27.7 pg (ref 26.0–34.0)
MCHC: 33.4 g/dL (ref 30.0–36.0)
MCHC: 33.9 g/dL (ref 30.0–36.0)
RDW: 15.8 % — ABNORMAL HIGH (ref 11.5–15.5)

## 2010-09-27 LAB — POCT I-STAT, CHEM 8
Calcium, Ion: 1.15 mmol/L (ref 1.12–1.32)
Chloride: 101 mEq/L (ref 96–112)
Glucose, Bld: 140 mg/dL — ABNORMAL HIGH (ref 70–99)
HCT: 37 % (ref 36.0–46.0)
Hemoglobin: 12.6 g/dL (ref 12.0–15.0)

## 2010-09-27 LAB — GLUCOSE, CAPILLARY: Glucose-Capillary: 115 mg/dL — ABNORMAL HIGH (ref 70–99)

## 2010-09-27 LAB — TYPE AND SCREEN
Unit division: 0
Unit division: 0

## 2010-09-27 LAB — BASIC METABOLIC PANEL
GFR calc non Af Amer: >60 mL/min (ref >60)
Potassium: 4.2 mEq/L (ref 3.5–5.1)
Sodium: 140 mEq/L (ref 135–145)

## 2010-09-27 LAB — MAGNESIUM: Magnesium: 2.4 mg/dL (ref 1.5–2.5)

## 2010-09-27 LAB — CREATININE, SERUM: Creatinine, Ser: 0.75 mg/dL (ref 0.4–1.2)

## 2010-09-28 ENCOUNTER — Inpatient Hospital Stay (HOSPITAL_COMMUNITY): Payer: Medicare HMO

## 2010-09-28 LAB — CBC
HCT: 33.1 % — ABNORMAL LOW (ref 36.0–46.0)
MCH: 27.5 pg (ref 26.0–34.0)
MCV: 82.8 fL (ref 78.0–100.0)
Platelets: 88 10*3/uL — ABNORMAL LOW (ref 150–400)
RBC: 4 MIL/uL (ref 3.87–5.11)

## 2010-09-28 LAB — BASIC METABOLIC PANEL
Calcium: 8.1 mg/dL — ABNORMAL LOW (ref 8.4–10.5)
GFR calc non Af Amer: >60 mL/min (ref >60)
Glucose, Bld: 139 mg/dL — ABNORMAL HIGH (ref 70–99)
Sodium: 136 mEq/L (ref 135–145)

## 2010-09-29 LAB — POCT I-STAT 4, (NA,K, GLUC, HGB,HCT)
Glucose, Bld: 113 mg/dL — ABNORMAL HIGH (ref 70–99)
Glucose, Bld: 77 mg/dL (ref 70–99)
Glucose, Bld: 92 mg/dL (ref 70–99)
HCT: 27 % — ABNORMAL LOW (ref 36.0–46.0)
HCT: 24 % — ABNORMAL LOW (ref 36.0–46.0)
HCT: 28 % — ABNORMAL LOW (ref 36.0–46.0)
Hemoglobin: 9.2 g/dL — ABNORMAL LOW (ref 12.0–15.0)
Hemoglobin: 9.5 g/dL — ABNORMAL LOW (ref 12.0–15.0)
Potassium: 4 mEq/L (ref 3.5–5.1)
Potassium: 4.1 mEq/L (ref 3.5–5.1)
Potassium: 5.1 mEq/L (ref 3.5–5.1)
Sodium: 135 mEq/L (ref 135–145)
Sodium: 139 mEq/L (ref 135–145)

## 2010-09-29 LAB — POCT I-STAT 3, ART BLOOD GAS (G3+)
Bicarbonate: 23.3 mEq/L (ref 20.0–24.0)
pH, Arterial: 7.296 — ABNORMAL LOW (ref 7.350–7.400)

## 2010-10-01 LAB — CBC
HCT: 29.8 % — ABNORMAL LOW (ref 36.0–46.0)
Hemoglobin: 9.8 g/dL — ABNORMAL LOW (ref 12.0–15.0)
MCHC: 32.9 g/dL (ref 30.0–36.0)
MCV: 84.7 fL (ref 78.0–100.0)
RDW: 17.2 % — ABNORMAL HIGH (ref 11.5–15.5)

## 2010-10-01 NOTE — Procedures (Signed)
[name], [name]             ACCOUNT NO.:  [id number]  MEDICAL RECORD NO.:  [id number]           PATIENT TYPE:  I  LOCATION:  2916                         FACILITY:  [facility]  PHYSICIAN:  Arturo Morton. Riley Kill, MD, FACCDATE OF BIRTH:  Dec 16, 1950  DATE OF PROCEDURE:  09/22/2010 DATE OF DISCHARGE:                           CARDIAC CATHETERIZATION   INDICATIONS:  The patient presented with chest pain, diaphoresis with nausea while walking around earlier.  She was seen at [facility] and subsequently transferred.  She has a history of multiple sclerosis, dyslipidemia.  She is a smoker.  She had a prior left subclavian artery stenosis.  The current study was done because of positive enzymes.  She unfortunately continues to smoke.  PROCEDURE: 1. Placement of catheters without left heart catheter. 2. Selective coronary arteriography.  DESCRIPTION OF PROCEDURE:  The procedure was performed from the right femoral artery.  She had a severe amount of groin pain.  We did give nitroglycerin to try to relieve this.  Of note, this is previously noted in the 2009 chart as well.  Views of left and right coronary arteries were obtained.  Multiple views of the left coronary system were obtained as well.  Subsequent to this, I reviewed the findings with the patient, and we agreed that a cardiac surgery consult for help in making final decision will be recommended.  She was agreeable to this approach and preferred this.  HEMODYNAMIC DATA:  The central aortic pressure was 131/67, mean 93.  ANGIOGRAPHIC DATA: 1. The left main is free of critical disease. 2. The left anterior descending artery courses to the apex.  The LAD     has an ostial stenosis that appears to be up to about 80%.  The     vessel then opens up, and then this previously stented across the     first diagonal.  The first diagonal has about 80% narrowing.  The     stent is widely patent with less than 20% narrowing in the  stent.     Distal to this, the vessel has some narrowing of probably about 50%     to 60% at the takeoff of the septal perforators.  Distal to this,     after the takeoff of the diagonal is probably about 70% narrowing,     which is previously noted.  It does not appear to be critical.  The     LAD courses to the apex.  The second diagonal was without critical     narrowing. 3. The circumflex vessel has been previously stented.  There is a tiny     ramus without critical narrowing.  The first marginal has mild     luminal irregularity bifurcates distally and is without critical     narrowing.  The AV circumflex then provides a second marginal that     has about 80% proximal narrowing that has been previously dilated     as part of a bifurcational intervention.  The stent itself is     somewhat hard to see and visualize well.  There is some diffuse in-  stent restenosis, but it does not appear to be critical and would     be gauged to probably about 50% to 60%.  The distal vessel is     relatively small in caliber. 4. The right coronary artery is a fairly large-caliber vessel.  Near     the junction of the proximal and mid vessel, there was an area of     eccentric plaquing of about 50%. In the midvessel, there is a     second stenosis of about 50%.  Distally, there is a 95% stenosis     which is progressed from the previous study.  The posterior     descending branch is a long bifurcating vessel that has diffuse 80%     narrowing in its midportion.  The posterolateral system is widely     patent and somewhat small in caliber.  CONCLUSION: 1. Progressive disease of the right coronary artery is noted above. 2. Continued patency of the LAD stent with a high-grade ostial     stenosis. 3. Moderate in-stent restenosis with bifurcational disease involving     the circumflex OM-2.  DISPOSITION:  I have spoken with the patient about the possible options. She would prefer to see a surgeon  prior to further decisions and I fully concur with that recommendations.     Arturo Morton. Riley Kill, MD, [facility]     TDS/MEDQ  D:  09/22/2010  T:  09/23/2010  Job:  [id number]  cc:   Learta Codding, MD,FACC  Electronically Signed by Shawnie Pons MD [facility] on 10/01/2010 09:12:44 PM

## 2010-10-02 ENCOUNTER — Inpatient Hospital Stay (HOSPITAL_COMMUNITY): Payer: Medicare HMO

## 2010-10-02 LAB — EXPECTORATED SPUTUM ASSESSMENT W GRAM STAIN, RFLX TO RESP C

## 2010-10-02 LAB — URINE MICROSCOPIC-ADD ON

## 2010-10-02 LAB — URINALYSIS, ROUTINE W REFLEX MICROSCOPIC
Hgb urine dipstick: NEGATIVE
Protein, ur: NEGATIVE mg/dL
Specific Gravity, Urine: 1.019 (ref 1.005–1.030)
Urine Glucose, Fasting: 250 mg/dL — AB
Urobilinogen, UA: 4 mg/dL — ABNORMAL HIGH (ref 0.0–1.0)

## 2010-10-14 NOTE — Op Note (Addendum)
NAMEALIZIA, [name]             ACCOUNT NO.:  [id number]  MEDICAL RECORD NO.:  [id number]           PATIENT TYPE:  LOCATION:                                 FACILITY:  PHYSICIAN:  Salvatore Decent. Dorris Fetch, M.D.DATE OF BIRTH:  [birth year]  DATE OF PROCEDURE: DATE OF DISCHARGE:                              OPERATIVE REPORT   PREOPERATIVE DIAGNOSIS:  Three-vessel coronary disease status post non-Q- wave myocardial infarction.  POSTOPERATIVE DIAGNOSIS:  Three-vessel coronary disease status post non- Q-wave myocardial infarction.  PROCEDURE:  Median sternotomy, extracorporeal circulation, coronary artery bypass grafting x4 (left internal mammary artery to LAD, sequential saphenous vein graft to distal right coronary and posterior descending, saphenous vein graft to obtuse marginal-2), endoscopic vein harvest right leg.  SURGEON:  Salvatore Decent. Dorris Fetch, MD  ASSISTANT:  Doree Fudge, PA  ANESTHESIA:  General. FINDINGS:  LAD and distal right coronary good-quality targets posterior descending and OM-2 1-mm poor-quality targets, good-quality conduits. Transesophageal echocardiography pre and post revealed good left ventricular function with no significant valvular pathology.  CLINICAL NOTE:  [name] is a 60 year old woman with a history of coronary disease and multiple sclerosis who presented with an unstable coronary syndrome and ruled in for a non-Q-wave myocardial infarction. At cardiac catheterization, she was found to have three-vessel coronary disease and was referred for coronary artery bypass grafting.  The indications, risks, benefits, and alternatives were discussed in detail with the patient.  She understood and accepted the risks and agreed to proceed.  OPERATIVE NOTE:  [name] was brought to the preop holding area on September 26, 2010.  There, the Anesthesia Service placed a Swan-Ganz catheter and an arterial blood pressure monitoring line.   Intravenous antibiotics were administered.  She was taken to the operating room, anesthetized and intubated.  A Foley catheter was placed.  The chest, abdomen, and legs were prepped and draped in usual sterile fashion.  A median sternotomy was performed.  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  The left internal mammary artery was harvested using standard technique.  Simultaneously, an incision was made in the medial aspect of the right leg at the level of the knee.  The greater saphenous vein was harvested from the right thigh and the upper calf endoscopically.  Heparin 2000 units was administered during the vessel harvest.  Remaining of the full heparin dose was given prior to opening the pericardium.  Of note, with division of the left internal mammary artery, there was excellent flow.  The saphenous vein was of good quality.  After harvesting the conduits, remainder of the full heparin dose was given.  Pericardium was opened.  The ascending aorta was cannulated via concentric 2-0 Ethibond pledgeted pursestring sutures.  A dual-staged venous cannula was placed via pursestring suture in the right atrial appendage.  Cardiopulmonary bypass was instituted, and the patient was cooled to 32 degrees Celsius.  The coronary arteries were inspected and anastomotic sites were chosen.  The conduits were inspected and cut to length.  A foam pad was placed in the pericardium to insulate the heart and protect the left phrenic nerve.  A temperature probe was placed  in the myocardial septum and a cardioplegic cannula was placed in the ascending aorta.  The aorta was crossclamped.  The left ventricle was emptied via the aortic root vent.  Cardiac arrest then was achieved with a combination of cold antegrade blood cardioplegia and topical iced saline. Cardioplegia 900 mL was administered.  The myocardial septum temperature cooled to 9 degrees Celsius.  There was a rapid diastolic arrest.   The following distal anastomoses were performed.  First, a reverse saphenous vein graft was placed sequentially to the distal right coronary and posterior descending.  The right coronary had a 95% stenosis.  An arteriotomy was made near the origin of the posterior descending.  The right coronary was a 2-mm good-quality target at that site.  A side-to-side anastomosis was performed to the vein graft to the distal right coronary with running 7-0 Prolene suture.  All anastomoses were probed proximally and distally at their completion to ensure patency.  There was excellent flow through this anastomosis.  The vein then was beveled at its distal end.  An arteriotomy was made in the posterior descending.  This was a small 1-mm poor-quality target.  The vein was anastomosed end-to-side with a running 7-0 Prolene suture.  The probe did pass easily through the anastomosis at its completion. Cardioplegia then was administered down the vein graft.  Both anastomoses were inspected for hemostasis.  Next, a reverse saphenous vein graft was placed end-to-side to obtuse marginal-2.  All of the obtuse marginal branches were relatively small vessels.  OM-2 was a 1-mm poor-quality target vessel.  The vein was of good quality, it was anastomosed end-to-side with a running 7-0 Prolene suture.  There was satisfactory flow through the graft and good hemostasis with cardioplegia administration.  Next, the left internal mammary artery was brought through a window in the pericardium.  The distal end was beveled.  Of note, it did have excellent flow.  It was then anastomosed end-to-side to the LAD.  The LAD was a 2-mm good-quality target vessel.  The mammary was a 1.5-mm good-quality conduit.  The anastomosis was performed end-to-side with a running 8-0 Prolene suture at the completion of the mammary to LAD anastomosis, the bulldog clamp was briefly removed and inspected for hemostasis.  Immediate and rapid  septal rewarming was noted.  The bulldog clamp was replaced.  The mammary pedicle was tacked to the epicardial surface of the heart with 6-0 Prolene sutures.  Additional cardioplegia was administered.  The vein grafts were cut to length.  The proximal vein graft anastomoses then was performed to 4.5- mm punch aortotomies with running 6-0 Prolene sutures.  At the completion of the final proximal anastomosis, the patient was placed in Trendelenburg position.  Lidocaine was administered.  The aortic root was de-aired and the aortic cross-clamp was removed.  Total cross-clamp time was 66 minutes.  While rewarming was completed, all proximal and distal anastomoses were inspected for hemostasis.  Epicardial pacing wires were placed on the right ventricle and right atrium, and the patient was rewarmed to a core temperature of 37 degrees Celsius.  She was weaned from cardiopulmonary bypass on the first attempt.  Total bypass time was 100 minutes.  The patient was DDD paced at 80 beats per minute time of separation from bypass.  The initial cardiac index was greater than 2 L per minute meter squared.  Postbypass transesophageal echocardiography revealed preserved left ventricular function with no significant valvular pathology.  A test dose of protamine was administered and was  well tolerated.  The atrial and aortic cannulae were removed.  The remainder of the protamine was administered without incident.  Chest was irrigated with warm saline.  Hemostasis was achieved.  The upper portion of the pericardium was reapproximated over the ascending aorta with interrupted 3-0 silk sutures, came together easily without tension or kinking of the underlying grafts.  A left pleural and single mediastinal chest tubes were placed in separate subcostal incisions.  The sternum was closed with interrupted heavy gauge double stainless steel wires.  The pectoralis fascia, subcutaneous tissue, and skin  were closed in standard fashion.  All sponge, needle, and instrument counts were correct at the end of the procedure.  The patient was taken from the operating room to the surgical intensive care unit in good condition and hemodynamically stable.     Salvatore Decent Dorris Fetch, M.D.     SCH/MEDQ  D:  09/26/2010  T:  09/27/2010  Job:  [id number]  cc:   Learta Codding, MD,FACC Doreen Beam, MD  Electronically Signed by Charlett Lango M.D. on 10/14/2010 12:26:06 PM Electronically Signed by Charlett Lango M.D. on 10/14/2010 12:54:21 PM Electronically Signed by Charlett Lango M.D. on 10/14/2010 01:12:27 PM Electronically Signed by Charlett Lango M.D. on 10/14/2010 01:34:05 PM Electronically Signed by Charlett Lango M.D. on 10/14/2010 01:54:49 PM Electronically Signed by Charlett Lango M.D. on 10/14/2010 02:14:45 PM Electronically Signed by Charlett Lango M.D. on 10/14/2010 02:35:51 PM Electronically Signed by Charlett Lango M.D. on 10/14/2010 02:59:39 PM Electronically Signed by Charlett Lango M.D. on 10/14/2010 03:24:27 PM Electronically Signed by Charlett Lango M.D. on 10/14/2010 03:48:52 PM Electronically Signed by Charlett Lango M.D. on 10/14/2010 04:26:59 PM Electronically Signed by Charlett Lango M.D. on 10/14/2010 07:32:31 PM

## 2010-10-14 NOTE — Discharge Summary (Addendum)
NAMEKENNEDEY, DIGILIO             ACCOUNT NO.:  [id number]  MEDICAL RECORD NO.:  [id number]           PATIENT TYPE:  I  LOCATION:  2016                         FACILITY:  [facility]  PHYSICIAN:  Salvatore Decent. Dorris Fetch, M.D.DATE OF BIRTH:  02-09-1951  DATE OF ADMISSION:  09/21/2010 DATE OF DISCHARGE:                              DISCHARGE SUMMARY   FINAL DIAGNOSIS:  Three-vessel coronary artery disease, post non-Q-wave myocardial infarction.  IN-HOSPITAL DIAGNOSIS:  Volume overload postoperatively.  SECONDARY DIAGNOSES: 1. History of coronary artery disease, status post acute coronary     syndrome in 2005 and 2009, undergoing cardiac catheterization     resulting bare-metal stent to the left circumflex and obtuse     marginal 2 as well as drug-eluting to the left anterior descending     2 days later. 2. Left subclavian artery occlusion, status post stenting. 3. Multiple sclerosis. 4. Diabetes mellitus, diet controlled. 5. Dyslipidemia. 6. Gastroesophageal reflux disease.  IN-HOSPITAL OPERATIONS AND PROCEDURES: 1. Cardiac catheterization done by Dr. Riley Kill on September 22, 2010. 2. Coronary artery bypass grafting x4 done by Dr. Dorris Fetch,     September 26, 2010, with left internal mammary artery to left     anterior descending, sequential saphenous vein graft to distal     right coronary artery and posterior descending, saphenous vein     graft to obtuse marginal 2.  Endoscopic vein harvest from the right     leg done.  Saphenous vein graft to OM 2, saphenous vein graft to OM-     1 and 2.  HISTORY AND PHYSICAL AND HOSPITAL COURSE:  Ms. Tilmon is a 60 year old female with history of coronary artery disease, status post PCI to the left circumflex and left anterior descending, multiple sclerosis, and tobacco use presenting with chest pain, diaphoresis, and nausea while walking around.  The patient was brought to the emergency room via EMS. Her chest pain was relieved following  administration of sublingual nitroglycerin, initiation of nitroglycerin drip and heparin at [facility].  The patient was then transferred over to [facility] for further management.  For further details of the patient's past medical history and physical exam, please see dictated H and P.  The patient was admitted to [facility] on September 21, 2010, with acute coronary syndrome.  Aspirin, Plavix, heparin, and nitroglycerin drip as well as statin and beta blocker were continued. Cardiac enzymes on September 21, 2010, showed troponin 1.27 with a CK-MB of 8.7.  Due to positive cardiac enzymes, we felt the patient would benefit from cardiac catheterization.  This was done by Dr. Riley Kill on September 22, 2010.  This showed three-vessel coronary artery disease. Following cardiac catheterization, Dr. Dorris Fetch was consulted.  Dr. Dorris Fetch saw and evaluated the patient.  He discussed with the patient undergoing coronary artery bypass grafting.  Risks and benefits were discussed.  The patient acknowledged understanding and agreed to proceed.  Surgery was scheduled for September 26, 2010.  A 2-D echocardiogram was done prior on September 23, 2010, showing ejection fraction of 55-60%.  Wall motion was normal.  There is trivial regurgitation in aortic valve with mild regurgitation  in the mitral valve.  The patient also had preoperative bilateral carotid duplex ultrasound done showing no significant ICA stenosis.  The patient remained stable preoperatively.  The patient was taken to the operating room on September 26, 2010, where she underwent coronary artery bypass grafting x4 by Dr. Dorris Fetch with a left internal mammary artery to left anterior descending, sequential saphenous vein graft to distal right coronary and posterior descending, saphenous vein graft to obtuse marginal one and two.  Endoscopic vein harvest from right leg is done.  The patient tolerated this  procedure well and transferred to the intensive care unit in stable condition. The patient was noted to be hemodynamically stable.  She was extubated evening of surgery.  Post extubation, the patient was noted to be alert and oriented x4.  Neuro intact.  Postoperatively, the patient was noted to be in normal sinus rhythm..  Blood pressure was stable.  All drips were able to be weaned and discontinued.  The patient was started on low- dose beta-blocker.  Chest x-ray obtained postop day #1 stable, bibasilar atelectasis.  The patient had minimal drainage from chest tubes and chest tubes were discontinued in routine fashion.  She did have a temperature of 102 on postop day #1, but this resolved by postop day #2. She was up ambulating well with nurses.  She is started on diuretics for volume overload.  The patient was felt to be stable and ready for transfer to [facility] on postop day #2.  On telemetry floor, the patient's heart rate went into low 100s.  Blood pressure remained stable.  Her Lopressor was increased to 25 mg b.i.d.  Currently, the patient's heart rate remains in the low 100s.  Blood pressure is well controlled.  An ACE inhibitor was not started at this time secondary to blood pressure well controlled on beta-blocker.  We will continue to monitor the patient's vital signs.  The patient did have a fever of max 101.2 on evening of September 30, 2010.  The white blood cell count noted to be stable at 7.5.  Urinalysis was ordered as well as sputum culture.  The patient was started on guaifenesin as well as p.o. Avelox for coverage. We will continue to monitor over the next 24 hours temperature and follow up on cultures.  Most recent x-ray on September 28, 2010, shows no pneumothorax with stable small pleural effusions.  The patient was also encouraged to continue using her incentive spirometer.  O2 sats are currently greater than 90% on room air.  Postoperatively, she continued to ambulate  well at the cardiac rehab.  She is tolerating diet well.  No nausea or vomiting noted.  All incisions are clean, dry, intact, and healing well.  The patient is a nondiabetic and blood sugars noted to be well controlled.  Preoperative hemoglobin A1c was 5.8.  The patient's most recent lab work shows white blood cell count 7.5, hemoglobin of 9.8, hematocrit 29.8, platelet count 160.  Sodium of 136, potassium 4.0, chloride 102, bicarbonate 27, BUN of 9, creatinine 0.67, glucose 139. The patient is tentatively ready for discharge to home in the next 24 hours pending her fever resolves and all cultures negative.  FOLLOWUP APPOINTMENTS:  A follow-up appointment has been arranged with Dr. Dorris Fetch for October 22, 2010, at 11:45 a.m.  The patient will need to obtain PMI chest x-ray 30 minutes prior to this appointment.  She will need to follow up with Dr. Andee Lineman in 2 weeks.  She will need to contact  the office to make these arrangements.  ACTIVITY:  The patient instructed no driving until released to do so. No lifting over 10 pounds.  She is told to ambulate 3-4 times per day and progress as tolerated and continue her breathing exercises.  INCISIONAL CARE:  The patient is told to shower washing her incisions using soap and water.  She is to contact the office if she develops any drainage or opening from any of her incision sites.  DIET:  The patient educated on diet to be low-fat, low-salt as well as diabetic diet.  DISCHARGE MEDICATIONS: 1. Lasix 40 mg daily x5 days. 2. Guaifenesin 600 mg b.i.d. 3. Avelox 400 mg daily x5 days. 4. Oxycodone 5 mg 1-2 tablets q.3 h. p.r.n. pain. 5. Potassium chloride 40 mEq daily x5 days. 6. Enteric-coated aspirin 325 mg daily. 7. Metoprolol 25 mg b.i.d. 8. Neurontin 600 mg 2 tablets at night. 9. Nitroglycerin SL p.r.n. pain. 10.Ranitidine 150 mg b.i.d. 11.Simvastatin 20 mg at night. 12.Trazodone 50 mg at night. 13.Vitamin B complex with D  daily. 14.Vitamin B12 daily. 15.Vitamin D3 b.i.d. 16.Wellbutrin SR 150 mg b.i.d.     Stephanie Acre Dasovich, PA   ______________________________ Salvatore Decent. Dorris Fetch, M.D.    KMD/MEDQ  D:  10/01/2010  T:  10/02/2010  Job:  [id number]  cc:   Learta Codding, MD,FACC  Electronically Signed by Cameron Proud PA on 10/02/2010 09:48:28 AM Electronically Signed by Charlett Lango M.D. on 10/14/2010 12:25:36 PM Electronically Signed by Charlett Lango M.D. on 10/14/2010 12:45:57 PM Electronically Signed by Charlett Lango M.D. on 10/14/2010 01:03:59 PM Electronically Signed by Charlett Lango M.D. on 10/14/2010 01:24:10 PM Electronically Signed by Charlett Lango M.D. on 10/14/2010 01:45:39 PM Electronically Signed by Charlett Lango M.D. on 10/14/2010 02:05:26 PM Electronically Signed by Charlett Lango M.D. on 10/14/2010 02:25:52 PM Electronically Signed by Charlett Lango M.D. on 10/14/2010 02:48:44 PM Electronically Signed by Charlett Lango M.D. on 10/14/2010 03:12:42 PM Electronically Signed by Charlett Lango M.D. on 10/14/2010 03:37:22 PM Electronically Signed by Charlett Lango M.D. on 10/14/2010 04:12:52 PM Electronically Signed by Charlett Lango M.D. on 10/14/2010 04:44:07 PM Electronically Signed by Charlett Lango M.D. on 10/14/2010 05:17:13 PM Electronically Signed by Charlett Lango M.D. on 10/14/2010 05:17:13 PM Electronically Signed by Charlett Lango M.D. on 10/14/2010 07:32:31 PM

## 2010-10-14 NOTE — Consult Note (Addendum)
[name], [name]             ACCOUNT NO.:  [id number]  MEDICAL RECORD NO.:  [id number]           PATIENT TYPE:  I  LOCATION:  2916                         FACILITY:  [facility]  PHYSICIAN:  Salvatore Decent. Dorris Fetch, M.D.DATE OF BIRTH:  1951/05/22  DATE OF CONSULTATION:  09/23/2010 DATE OF DISCHARGE:                                CONSULTATION   REASON FOR CONSULTATION:  Three-vessel coronary artery disease.  HISTORY OF PRESENT ILLNESS:  [name] is a 60 year old woman with a history of multiple sclerosis, coronary artery disease, previous left subclavian stent, and tobacco abuse.  She was in her usual state of health until 2 days ago when she was walking around the store with some friends and she developed pain in her chest, this was midportion of her chest.  She had some mild nausea and broke out to sweat and thought she was having indigestion as she just eaten a lunch.  She took Tums which did not help.  She ultimately took a sublingual nitroglycerin, which also did not help, and she became short of breath, her husband called EMS, and with additional nitroglycerin, her pain resolved.  She was found to have elevated cardiac enzymes. Her initial CK was 78 with MB of 2.7, her troponin was 0.23.  She subsequently had an increase in her CK to 118, MB increased to 8.7, troponin went up to 1.3.  She was transferred to [facility] and last night underwent cardiac catheterization where she was found to have three-vessel disease.  She had a high-grade ostial LAD lesion and moderate disease in her circumflex including some in-stent restenosis.  There was a 95% distal right coronary stenosis and 80% mid posterior descending stenosis.  She has been relatively free of pain since admission, but does get pain when the nitroglycerin drip is turned down.  PAST MEDICAL HISTORY:  Significant for multiple sclerosis.  She was wheelchair bound for 10 years, but for the past 6 to 7 years has  been able to ambulate.  Coronary artery disease with acute coronary syndrome in 2005 and 2009.  Bare-metal stent, left circumflex and OM2, drug- eluting stent to the LAD.  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  Diet-controlled type 2 diabetes, well-controlled. Dyslipidemia, gastroesophageal reflux, depression.  MEDICATIONS ON ADMISSION: 1. Aspirin 81 mg daily. 2. Lopressor 25 mg daily. 3. Plavix 75 mg daily. 4. Multivitamin 1 daily. 5. Neurontin 600 mg daily. 6. Simvastatin 40 mg daily. 7. Wellbutrin 300 mg daily. 8. Restoril nightly. 9. Trazodone 50 mg nightly.  ALLERGIES:  She has an allergy to MORPHINE.  FAMILY HISTORY:  Significant for coronary artery disease in her father and a CVA in her sister.  SOCIAL HISTORY:  She lives with her husband.  She is disabled secondary to MS, but is able to be moderately active.  She has a long history of tobacco abuse, but quit about 2 weeks ago.  Has been using a nicotine inhaler.  She does not drink.  REVIEW OF SYSTEMS:  The patient has noted that she has been tired and rundown lately.  Otherwise, all systems are negative with the exception of things  noted in the HPI.  PHYSICAL EXAMINATION:  GENERAL:  [name] is a thin, 60 year old white female, in no acute distress.  She is well-developed, well- nourished, with no focal deficits. VITAL SIGNS:  Blood pressure 118/60, pulse 67, respirations of 13. HEENT:  Unremarkable. NECK:  Supple without thyromegaly, adenopathy, or bruits. CARDIAC:  Has regular rate and rhythm.  Normal S1-S2.  No rubs, murmurs, or gallops.  There is no subclavicular or supraclavicular bruit on the left.  Pulses are 2+ and intact throughout. EXTREMITIES:  There is no peripheral edema. LUNGS:  Clear. ABDOMEN:  Soft, nontender.  LABORATORY DATA:  Echocardiogram has been completed, but has not been read at this point in time.  Cardiac catheterization as previously noted.  Her sodium is 139,  potassium 4.0, BUN 7, creatinine 0.71, hemoglobin A1c 5.8.  PTY12 assay showed 9% inhibition.  White count 6.2, hematocrit 35, platelets 203.  PT 12.8.  Peak CK 118.  Peak MB 8.7. Peak troponin 1.41.  Cholesterol was 162, HDL 45, LDL 105.  EKG showed inferior ST-T wave abnormality.  Chest x-ray showed no active disease.  IMPRESSION:  [name] is a 60 year old woman, multiple cardiac risk factors, known history of atherosclerotic cardiovascular disease including 2 previous stents to her LAD and circumflex.  She now presents with an unstable coronary syndrome and ruled in for non-Q-wave MI catheterization.  She has three-vessel coronary artery disease, coronary artery bypass grafting is indicated for survival benefit as well as relief of symptoms.  I have discussed in detail with her the indications, risks, benefits, and alternative treatments.  She understands the risk of surgery include but not limited to death, stroke, MI, DVT, PE, bleeding, possible need for transfusions, infections, as well as other organ system dysfunction including respiratory, renal, or GI complications.  She understands and accepts these risks and agrees to proceed.  She is scheduled to have carotid and upper extremity duplex evaluation.  She does have a good pulse in her right arm.  Hopefully, her left mammary artery will be usable, so now we will give consideration using the right mammary to the LAD.  She has been on Plavix, but her inhibition is only 9% indicating nonresponder. Therefore, we should be able to proceed with surgery without having to wait a full 5 to 7 days.  We will tentatively plan to proceed with surgery at the first available operating time which is Friday, September 26, 2010.     Salvatore Decent Dorris Fetch, M.D.     SCH/MEDQ  D:  09/23/2010  T:  09/24/2010  Job:  [id number]  Electronically Signed by Charlett Lango M.D. on 10/14/2010 12:25:40 PM Electronically Signed by Charlett Lango M.D. on 10/14/2010 12:52:59 PM Electronically Signed by Charlett Lango M.D. on 10/14/2010 01:11:00 PM Electronically Signed by Charlett Lango M.D. on 10/14/2010 01:32:31 PM Electronically Signed by Charlett Lango M.D. on 10/14/2010 01:53:13 PM Electronically Signed by Charlett Lango M.D. on 10/14/2010 02:13:09 PM Electronically Signed by Charlett Lango M.D. on 10/14/2010 02:34:06 PM Electronically Signed by Charlett Lango M.D. on 10/14/2010 02:57:43 PM Electronically Signed by Charlett Lango M.D. on 10/14/2010 03:22:32 PM Electronically Signed by Charlett Lango M.D. on 10/14/2010 03:46:49 PM Electronically Signed by Charlett Lango M.D. on 10/14/2010 04:24:34 PM Electronically Signed by Charlett Lango M.D. on 10/14/2010 07:32:31 PM

## 2010-10-17 ENCOUNTER — Encounter (INDEPENDENT_AMBULATORY_CARE_PROVIDER_SITE_OTHER): Payer: 59 | Admitting: Cardiology

## 2010-10-17 ENCOUNTER — Encounter: Payer: Self-pay | Admitting: Cardiology

## 2010-10-17 DIAGNOSIS — Z9861 Coronary angioplasty status: Secondary | ICD-10-CM

## 2010-10-17 DIAGNOSIS — I2510 Atherosclerotic heart disease of native coronary artery without angina pectoris: Secondary | ICD-10-CM

## 2010-10-21 NOTE — Assessment & Plan Note (Signed)
Summary: eph post CABG needs 2 wk post fu   Visit Type:  Follow-up Primary Provider:  Kirstie Peri, MD   History of Present Illness: The patient underwent recent coronary artery bypass grafting.  She has a history of acute coronary syndrome in 2005 in 2009.  She has had prior bare-metal stents to the left circumflex obtuse marginal as well as drug-eluting stents to the LAD. In February 2012 had a catheterization and was found to have multivessel coronary artery disease.  The patient underwent coronary artery bypass grafting x 4 by Dr. Dorris Fetch.  She received a left internal mammary artery to the LAD, a sequential saphenous vein graft to the distal right coronary artery and posterior descending artery, a saphenous vein graft to the second obtuse marginal branch.  During surgery the patient was found to have a small PFO by TEE.  Echocardiogram prior to surgery demonstrated an ejection fraction of 55 to 60%.  There was only mild mitral regurgitation and no significant valvular lesions.  She was found to have increased thickening of the septum consistent with lipomatous hypertrophy.  The patient's surgery was 3 weeks ago.  She still is complaining of some post sternotomy pain.  She also has difficulty sleeping and uses a combination of a benzodiazepine which oxycodone.  She will start cardiac rehab in the next couple of days.  The patient presented initially to [facility] with an acute coronary syndrome.  She was transferred for further management.  She did rule in for a non-ST elevation myocardial infarction. Chest x-ray during his hospitalization also was notable for COPD. the patient has been seen in the past in August of 2001 by Dr. Excell Seltzer.  She has a history of left subclavian stenosis.  She was found to have severe left subclavian stenosis by ultrasound and CK and underwent an angiogram and stenting on December 23. The patient saw me in follow-up in September of 2011.  The patient also had  repeat carotid Dopplers done which showed no significant internal carotid artery stenosis. Otherwise from a cardiovascular perspective the patient is stable.  She has no heart failure symptoms or rhythm abnormalities.  She has no orthopnea PND palpitations or syncope. EKG was performed in the office today.  The patient is in normal sinus rhythm.  She is an incomplete right bundle-branch block and small Q waves in inferior leads.  However no mention was made of wall motion abnormality in the inferior wall by echocardiogram.   Current Medications (verified): 1)  Metoprolol Tartrate 25 Mg Tabs (Metoprolol Tartrate) .... Take 1 Tablet By Mouth Twice A Day 2)  Simvastatin 20 Mg Tabs (Simvastatin) .... Take 1 Tablet By Mouth At Bedtime 3)  Wellbutrin Sr 150 Mg Xr12h-Tab (Bupropion Hcl) .... Take 1 Tablet By Mouth Two Times A Day 4)  Gabapentin 600 Mg Tabs (Gabapentin) .... Take 1 Tablet By Mouth Once A Day At Bedtime 5)  Aspirin 325 Mg Tabs (Aspirin) .... Take 1 Tablet By Mouth Once A Day 6)  Trazodone Hcl 50 Mg Tabs (Trazodone Hcl) .... As Needed 7)  Nitrostat 0.4 Mg Subl (Nitroglycerin) .... Place 1 Under Tongue For Chest Pain Every 5 Min Up To 3 Doses,if No Relief,proceed To Ed 8)  Ranitidine Hcl 150 Mg Caps (Ranitidine Hcl) .... Take 1 Tablet By Mouth Two Times A Day 9)  Reclast 5 Mg/161ml Soln (Zoledronic Acid) .... One Dose Iv Yearly 10)  Temazepam 15 Mg Caps (Temazepam) .... Take 2 By Mouth As Needed Sleep 11)  Mucinex 600 Mg Xr12h-Tab (Guaifenesin) .... Take 1 Tablet By Mouth Two Times A Day 12)  Chelated Potassium 99 Mg Tabs (Potassium) .... Take 1 Tablet By Mouth Once A Day 13)  Roxicodone 5 Mg Tabs (Oxycodone Hcl) .... Take One By Mouth Every 3 Hours As Needed 14)  B Complex  Tabs (B Complex Vitamins) .... Take 1 Tablet By Mouth Once A Day 15)  Vitamin B-12 250 Mcg Tabs (Cyanocobalamin) .... Take 1 Tablet By Mouth Once A Day 16)  Vitamin D3 1000 Unit Tabs (Cholecalciferol) .... Take 1  Tablet By Mouth Two Times A Day  Allergies (verified): 1)  Ambien (Zolpidem Tartrate) 2)  Morphine Sulfate (Morphine Sulfate)  Comments:  Nurse/Medical Assistant: The patient's medication list and allergies were reviewed with the patient and were updated in the Medication and Allergy Lists.  Past History:  Past Surgical History: Last updated: 04/29/2009 Cholecystectomy  Family History: Last updated: 07/20/2009 Mother died at age 34 from cancer, father died at age 5 from cancer, sister died at age 42 from "blood poisoning"  No premature CAD or aneurysm in the family.  Social History: Last updated: 07/20/09 Disabled  Married with 2 children Tobacco Use - Yes.  Alcohol Use - yes Drug Use - no  Risk Factors: Smoking Status: current (05/08/2010) Packs/Day: 1/2 PPD (05/08/2010)  Past Medical History: HYPERLIPIDEMIA-MIXED (ICD-272.4) Hx of MI, 2009, status post stent placement x2 Multiple sclerosis.  Hoarseness, chronic.  Diet-controlled diabetes.  Dyslipidemia.  Intolerant of high-dose Lipitor.  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  History of gastroesophageal reflux disease. 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  The patient denies fatigue, malaise, fever, weight gain/loss, vision loss, decreased hearing, hoarseness, palpitations, shortness of breath, prolonged cough, wheezing, sleep apnea, coughing up blood, abdominal pain, blood in stool, nausea, vomiting, diarrhea, heartburn, incontinence, blood in urine, muscle weakness, joint pain, leg swelling, rash, skin lesions, headache, fainting, dizziness, depression, anxiety, enlarged lymph nodes, easy bruising or bleeding, and  environmental allergies.         incisional pain  Vital Signs:  Patient profile:   60 year old female Height:      62 inches Weight:      104 pounds Pulse rate:   76 / minute BP sitting:   110 / 75  (left arm) Cuff size:   regular  Vitals Entered By: Carlye Grippe (October 17, 2010 11:04 AM)  Physical Exam  Additional Exam:  General: Well-developed, well-nourished in no distress head: Normocephalic and atraumatic eyes PERRLA/EOMI intact, conjunctiva and lids normal nose: No deformity or lesions mouth normal dentition, normal posterior pharynx neck: Supple, no JVD.  No masses, thyromegaly or abnormal cervical nodes, soft carotid bruits chest status post well-healed sternotomy scar..   No left subclavian bruit lungs: Normal breath sounds bilaterally without wheezing.  Normal percussion heart: regular rate and rhythm with normal S1 and S2, no S3 or S4.  PMI is normal.  No pathological murmurs abdomen: Normal bowel sounds, abdomen is soft and nontender without masses, organomegaly or hernias noted.  No hepatosplenomegaly musculoskeletal: Back normal, normal gait muscle strength and tone normal pulsus: Pulse is normal in all 4 extremities Extremities: No peripheral pitting edema neurologic: Alert and oriented x 3 skin: Intact without lesions  or rashes cervical nodes: No significant adenopathy psychologic: Normal affect    EKG  Procedure date:  10/17/2010  Findings:      normal sinus rhythm.  Incomplete right bundle branch block  Impression & Recommendations:  Problem # 1:  CORONARY ATHEROSCLEROSIS NATIVE CORONARY ARTERY (ICD-414.01) Status post multiple prior percutaneous interventions now status post coronary artery bypass grafting in the setting of a non-ST elevation myocardial infarction.  Preserved LV function.  A left internal mammary artery graft was used.  The patient has a prior history of left subclavian artery stenosis but this was stented. Normal LV  function  The following medications were removed from the medication list:    Plavix 75 Mg Tabs (Clopidogrel bisulfate) .Marland Kitchen... Take 1 tablet by mouth once a day    Norvasc 5 Mg Tabs (Amlodipine besylate) .Marland Kitchen... Take 1 tablet by mouth once a day    Isosorbide Mononitrate Cr 30 Mg Xr24h-tab (Isosorbide mononitrate) .Marland Kitchen... Take 1 tablet by mouth once a day Her updated medication list for this problem includes:    Metoprolol Tartrate 25 Mg Tabs (Metoprolol tartrate) .Marland Kitchen... Take 1 tablet by mouth twice a day    Aspirin 325 Mg Tabs (Aspirin) .Marland Kitchen... Take 1 tablet by mouth once a day    Nitrostat 0.4 Mg Subl (Nitroglycerin) .Marland Kitchen... 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  Patient will need follow Dopplers of the left subclavian artery in the future.  Will check blood pressures in both arms in the future. The following medications were removed from the medication list:    Plavix 75 Mg Tabs (Clopidogrel bisulfate) .Marland Kitchen... Take 1 tablet by mouth once a day Her updated medication list for this problem includes:    Aspirin 325 Mg Tabs (Aspirin) .Marland Kitchen... Take 1 tablet by mouth once a day  Problem # 3:  TOBACCO USER (ICD-305.1) COPDpatient has stopped smoking  Problem # 4:  INSOMNIA (ICD-780.52) insomnia: I told patient that if she runs out of her benzodiazepine she can go back on her trazodone and doubled the dose 100 mg p.o. daily.  Patient Instructions: 1)  Your physician recommends that you continue on your current medications as directed. Please refer to the Current Medication list given to you today. 2)  Follow up in  6 months

## 2010-10-22 ENCOUNTER — Encounter (INDEPENDENT_AMBULATORY_CARE_PROVIDER_SITE_OTHER): Payer: Self-pay | Admitting: Thoracic Surgery (Cardiothoracic Vascular Surgery)

## 2010-10-22 ENCOUNTER — Other Ambulatory Visit: Payer: Self-pay | Admitting: Thoracic Surgery (Cardiothoracic Vascular Surgery)

## 2010-10-22 ENCOUNTER — Ambulatory Visit
Admission: RE | Admit: 2010-10-22 | Discharge: 2010-10-22 | Disposition: A | Discharge status: Home / Self Care | Payer: 59 | Source: Ambulatory Visit | Attending: Thoracic Surgery (Cardiothoracic Vascular Surgery) | Admitting: Thoracic Surgery (Cardiothoracic Vascular Surgery)

## 2010-10-22 ENCOUNTER — Encounter: Payer: Self-pay | Admitting: Cardiology

## 2010-10-22 DIAGNOSIS — J90 Pleural effusion, not elsewhere classified: Secondary | ICD-10-CM

## 2010-10-22 DIAGNOSIS — I2510 Atherosclerotic heart disease of native coronary artery without angina pectoris: Secondary | ICD-10-CM

## 2010-10-23 ENCOUNTER — Encounter: Payer: Self-pay | Admitting: Cardiology

## 2010-10-23 ENCOUNTER — Telehealth: Payer: Self-pay | Admitting: *Deleted

## 2010-10-23 NOTE — Assessment & Plan Note (Signed)
OFFICE VISIT  KELLEIGH, SKERRITT A DOB:  Sep 18, 1950                                        October 22, 2010 CHART #:  [id number]  REASON FOR VISIT:  Followup after recent bypass surgery.  HISTORY OF PRESENT ILLNESS:  Ms. [name] is a 60 year old woman with multiple sclerosis who presented with an unstable coronary syndrome back in February.  She ruled in for non-Q-wave MI.  She had three-vessel disease at catheterization and underwent coronary artery bypass grafting x4 on September 26, 2010.  Postoperatively, she had some issues with volume overload and congestive heart failure, but otherwise did quite well.  She was discharged home without any major complications.  She says that at home she is still having a lot of sternal pain.  She is down to her last pain pill on the way down here this morning.  She has only been taking about 2 a day, but would be taking more if she had them.  She has pain with coughing and has kind of moderate pain all the time.  She has not had any anginal-type symptoms.  She states she is not really having shortness of breath.  She can walk about 30-35 minutes at a time without having to stop and rest and overall she feels well.  CURRENT MEDICATIONS: 1. Aspirin 325 mg daily. 2. Metoprolol 25 mg b.i.d. 3. Neurontin 600 mg p.o. nightly. 4. Ranitidine 150 mg b.i.d. 5. Simvastatin 20 mg nightly. 6. Trazodone 50 mg nightly. 7. Wellbutrin SR 150 mg b.i.d. 8. Temazepam 30 mg nightly p.r.n. 9. She is taking oxycodone for pain and guaifenesin p.r.n.  PHYSICAL EXAMINATION:  GENERAL:  Ms. Charbonneau is a well-appearing 10- year-old woman in no acute distress.  She is thin, but well developed, well nourished. NEUROLOGICAL:  She is alert and oriented x3 with no focal deficits. VITAL SIGNS:  Blood pressure is 128/80, pulse is 91, respirations are 20, and oxygen saturation 96% on room air. CHEST:  Her sternal wound is clean, dry, and intact.  Her  sternum is stable. EXTREMITIES:  Her leg incision is healing well.  There is no peripheral edema. CARDIAC:  Regular rate and rhythm.  Normal S1 and S2.  No rubs, murmurs, or gallops. LUNGS:  Diminished breath sounds and dullness to percussion at the left base, otherwise clear.  Chest x-ray shows a moderate left pleural effusion.  IMPRESSION:  Ms. Centanni is a 60 year old woman.  She is now about a month out from coronary artery bypass grafting.  Overall, she is doing well at this point in time.  She does have a left pleural effusion.  I think it is large enough that we should drain that.  From a cardiac standpoint, though she is doing well and exercises, she is doing well. She is having a lot of pain and I think she was afraid to call and ask for more pain medication, so had been rationing herself.  I encouraged her to go ahead and take the pain medication when she needs it so that she can be active and that the pain can start improving significantly over the next 2-3 weeks.  I gave her a prescription for oxycodone 5 mg tablets 1-2 three times daily as needed for pain, I give her 50 tablets, no refills.  I did encourage her to call if she is running  low.  She can contact either my office or primary care physician for additional pain medication if needed.  I do think she would benefit from a thoracentesis.  I advised her of the indications, risks, and benefits.  She understands the risk of bleeding or pneumothorax.  She accepts the risks and agrees to proceed.  PROCEDURE NOTE:  After obtaining informed consent using 1% lidocaine local anesthetic, left thoracentesis was performed.  Approximately 700 mL of serous fluid was evacuated.  The patient did have pain towards the end of the procedure, but no dizziness or faintness.  Ms. Heggs will go for a chest x-ray now to establish a post- thoracentesis baseline.  I plan to see her back in 3 weeks.  I am going to give her prednisone  taper beginning to 25 and tapering to 5 over 5 days.  Salvatore Decent Dorris Fetch, M.D. Electronically Signed  SCH/MEDQ  D:  10/22/2010  T:  10/23/2010  Job:  [id number]  cc:   Learta Codding, MD,FACC Kirstie Peri, MD

## 2010-10-24 ENCOUNTER — Encounter: Payer: 59 | Admitting: Cardiology

## 2010-10-24 ENCOUNTER — Ambulatory Visit: Payer: 59 | Admitting: Cardiology

## 2010-10-28 NOTE — Progress Notes (Signed)
Summary: request for refill on pain med  Phone Note Other Incoming   Summary of Call: Patient walked in this morning to request refill on Roxicodone.  States Dr. Andee Lineman told her he would refill if needed.  States the Hydrocodone makes her loopy and does nothing for pain.  Discussed above with Dr. Andee Lineman - okay for #30 tabs as she is post CABG.  Patient picked up script this afternoon. Initial call taken by: Hoover Brunette, LPN,  October 23, 2010 3:49 PM

## 2010-10-28 NOTE — Miscellaneous (Signed)
Summary: Rehab Report/  FAXED CARDIAC REHAB REFERRAL  Rehab Report/  FAXED CARDIAC REHAB REFERRAL   Imported By: Dorise Hiss 10/22/2010 14:42:36  _____________________________________________________________________  External Attachment:    Type:   Image     Comment:   External Document

## 2010-10-28 NOTE — Miscellaneous (Signed)
Summary: rx - roxicodone  Clinical Lists Changes  Medications: Rx of ROXICODONE 5 MG TABS (OXYCODONE HCL) take one by mouth every 3 hours as needed;  #30 x 0;  Signed;  Entered by: Hoover Brunette, LPN;  Authorized by: Lewayne Bunting, MD, [facility];  Method used: Print then Give to Patient    Prescriptions: ROXICODONE 5 MG TABS (OXYCODONE HCL) take one by mouth every 3 hours as needed  #30 x 0   Entered by:   Hoover Brunette, LPN   Authorized by:   Lewayne Bunting, MD, [facility]   Signed by:   Hoover Brunette, LPN on 16/05/9603   Method used:   Print then Give to Patient   RxID:   [id number]

## 2010-11-07 ENCOUNTER — Other Ambulatory Visit: Payer: Self-pay | Admitting: Thoracic Surgery (Cardiothoracic Vascular Surgery)

## 2010-11-07 DIAGNOSIS — I2510 Atherosclerotic heart disease of native coronary artery without angina pectoris: Secondary | ICD-10-CM

## 2010-11-08 NOTE — H&P (Signed)
[name], MYSLIWIEC NO.:  [id number]  MEDICAL RECORD NO.:  [id number]           PATIENT TYPE:  I  LOCATION:  2916                         FACILITY:  [facility]  PHYSICIAN:  Sarajane Marek, MD     DATE OF BIRTH:  1951/07/08  DATE OF ADMISSION:  09/21/2010 DATE OF DISCHARGE:                             HISTORY & PHYSICAL   PRIMARY CARDIOLOGIST:  Learta Codding, MD,FACC  CHIEF COMPLAINT:  Chest pain.  HISTORY OF PRESENT ILLNESS:  [name] is a 60 year old female with history of coronary artery disease status post PCI to left circumflex and LAD, multiple sclerosis, and tobacco use presenting with chest pain, diaphoresis, and nausea while walking around today.  Pain persisted while at rest, so she took Tums which did not help.  She took sublingual nitroglycerin without significant relief.  She then became short of breath and at that time EMS was called.  Her pain is now relieved following administration of further sublingual nitroglycerin and initiation of nitroglycerin drip and heparin at [facility] prior to transfer.  She reports that this pain is different from her prior MI but was certainly concerning.  ALLERGIES:  MORPHINE.  MEDICATIONS: 1. Aspirin 81 mg daily. 2. Lopressor 25 mg daily. 3. Plavix 75 mg daily. 4. Multivitamin once daily. 5. Neurontin 600 mg daily. 6. Simvastatin 40 mg daily. 7. Wellbutrin 300 mg daily. 8. Restoril, unknown dose. 9. Trazodone 50 mg at bedtime.  PAST MEDICAL HISTORY: 1. Coronary artery disease status post acute coronary syndrome in     2005, 2009 with cardiac catheterization resulting in bare metal     stent to the left circumflex and OM2 as well as DES to the LAD 2     days later. 2. 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 3. Multiple sclerosis 4. Diabetes mellitus, diet-controlled. 5. Dyslipidemia. 6. GERD.  SOCIAL HISTORY:  The patient lives in [place] with her husband.  She is  on disability secondary to MS.  She had a long history of tobacco use but reports over the last several weeks she has been using nicotine vaporizer.  Alcohol none.  FAMILY HISTORY:  Positive for sister with CVA.  REVIEW OF SYSTEMS:  Negative except as per HPI.  PHYSICAL EXAMINATION:  VITAL SIGNS: Temperature 36.3, heart rate 73, respiratory rate 16, blood pressure 120/70, oxygen saturation 100% on room air. GENERAL:  No acute distress. EYES:  Extraocular muscles intact.  Sclerae clear. ENT:  Oropharynx is clear.  Mucous membranes are moist.  Nares are clear NECK:  Supple without lymphadenopathy, no thyromegaly, bruit or JVD. CARDIOVASCULAR:  Heart rate is regular with normal S1, S2.  No S3, S4. No murmur, gallop, or rub.  Pulses are 2+ and symmetric x4. LUNGS:  Clear bilaterally without increased work of breathing or tachypnea. SKIN:  No rashes or lesion. ABDOMEN:  Soft and nontender with normal bowel sounds. EXTREMITIES:  No clubbing, cyanosis, or edema. MUSCULOSKELETAL:  No joint deformity. NEURO:  The patient is alert and oriented x3 with no focal deficits. PSYCHIATRIC:  Normal judgment, insight.  Chest x-ray, no edema or infiltrate.  EKG is normal sinus  rhythm at a rate of 76 with lateral ST depressions which are new compared to prior EKG.  LABORATORY DATA:  CK 78, MB 2.7, troponin 0.23.  Sodium 142, potassium 3.7, chloride 107, bicarb 27, BUN 9, creatinine 0.6, glucose 135.  White blood cell count 5.5, hemoglobin 11.9, hematocrit 36.5, platelets 235,000.  ASSESSMENT: 1. Acute coronary syndrome. 2. Diabetes mellitus diet controlled. 3. Multiple sclerosis. 4. Gastroesophageal reflux disease. 5. Tobacco use. 6. Depression.  PLAN: 1. ACS:  We will continue aspirin, Plavix, heparin, nitroglycerin drip     statin, beta-blocker.  We will follow serial cardiac biomarkers,     serial EKGs, monitor on telemetry.  Check TSH, BNP, A1c, and     magnesium.  Follow daily  weights.  Strict Is and Os, maintain     potassium greater than 4, magnesium greater than 2.  Anticipate     repeat catheterization prior to discharge. 2..  Multiple sclerosis:  Husband to bring medication list from home. Will continue Neurontin and Restoril. 1. Depression:  Continue trazodone and Wellbutrin. 2. GERD.  We will give PPI. 3. Tobacco use:  Smoking cessation counseling.     Sarajane Marek, MD     HH/MEDQ  D:  09/21/2010  T:  09/21/2010  Job:  [id number]  Electronically Signed by Sarajane Marek MD on 11/08/2010 02:57:28 PM

## 2010-11-10 ENCOUNTER — Encounter (INDEPENDENT_AMBULATORY_CARE_PROVIDER_SITE_OTHER): Payer: Self-pay | Admitting: Thoracic Surgery (Cardiothoracic Vascular Surgery)

## 2010-11-10 ENCOUNTER — Ambulatory Visit
Admission: RE | Admit: 2010-11-10 | Discharge: 2010-11-10 | Disposition: A | Discharge status: Home / Self Care | Payer: 59 | Source: Ambulatory Visit | Attending: Thoracic Surgery (Cardiothoracic Vascular Surgery) | Admitting: Thoracic Surgery (Cardiothoracic Vascular Surgery)

## 2010-11-10 DIAGNOSIS — I2510 Atherosclerotic heart disease of native coronary artery without angina pectoris: Secondary | ICD-10-CM

## 2010-11-10 LAB — BASIC METABOLIC PANEL
CO2: 25 mEq/L (ref 19–32)
Calcium: 9.3 mg/dL (ref 8.4–10.5)
Chloride: 108 mEq/L (ref 96–112)
Creatinine, Ser: 0.72 mg/dL (ref 0.4–1.2)
GFR calc Af Amer: >60 mL/min (ref >60)
Sodium: 141 mEq/L (ref 135–145)

## 2010-11-10 LAB — PROTIME-INR
INR: 0.99 (ref 0.00–1.49)
Prothrombin Time: 13 seconds (ref 11.6–15.2)

## 2010-11-10 LAB — CBC
Hemoglobin: 13.1 g/dL (ref 12.0–15.0)
MCHC: 34.7 g/dL (ref 30.0–36.0)
MCV: 85.9 fL (ref 78.0–100.0)
RBC: 4.4 MIL/uL (ref 3.87–5.11)
WBC: 5.8 10*3/uL (ref 4.0–10.5)

## 2010-11-11 NOTE — Assessment & Plan Note (Signed)
OFFICE VISIT  GEAN, LAURSEN A DOB:  07/24/1951                                        November 10, 2010 CHART #:  [id number]  REASON FOR VISIT:  Followup after left thoracentesis.  HISTORY OF PRESENT ILLNESS:  The patient is a 60 year old woman with multiple sclerosis who underwent coronary bypass grafting x4 on February 17.  She was seen in the office on March 14, at which time she was doing well, but did have a fairly large left pleural effusion.  We did a left thoracentesis, which caused significant discomfort.  We got about 700 mL of fluid out.  She was then treated with a prednisone taper.  She now returns for followup chest x-ray.  She says she has been breathing fine. She does not have any orthopneas or paroxysmal nocturnal dyspnea.  She still has some incisional discomfort, but overall is feeling well.  She started cardiac rehab.  She been doing that for about 3 weeks now.  PHYSICAL EXAMINATION:  GENERAL:  The patient is a 60 year old woman in no acute distress. VITAL SIGNS:  Blood pressure is 121/79, pulse 75, respirations 16, ox saturation 95% on room air. LUNGS:  Clear to auscultation and percussion with equal breath sounds bilaterally.  Chest x-ray shows good aeration of the lungs bilaterally.  There is no residual pleural effusion.  IMPRESSION:  The patient is doing well at this point in time.  She is now 6 weeks out from surgery.  She is no longer on a 10-pound weight restriction, but advised her not to do anything over 20 pounds for another 2 weeks.  Her left pleural effusion is resolved with thoracentesis and steroids.  No specific followup for that is needed. She would need another x-ray if she were to have recurrent shortness of breath.  Regarding her incisional discomfort, I have advised her that best she continue to get better over the next couple of months and typically will completely go away with time.  She is going to follow up  with Dr. Andee Lineman in [place].  I will be happy to see her back anytime if I could be of any assistance with her care.  Salvatore Decent Dorris Fetch, M.D. Electronically Signed  SCH/MEDQ  D:  11/10/2010  T:  11/11/2010  Job:  [id number]  cc:   Learta Codding, MD,FACC

## 2010-12-23 NOTE — Cardiovascular Report (Signed)
[name], [name]             ACCOUNT NO.:  [id number]   MEDICAL RECORD NO.:  [id number]          PATIENT TYPE:  INP   LOCATION:  3708                         FACILITY:  [facility]   PHYSICIAN:  Corky Crafts, MDDATE OF BIRTH:  1950-11-10   DATE OF PROCEDURE:  12/18/2007  DATE OF DISCHARGE:                            CARDIAC CATHETERIZATION   PROCEDURES PERFORMED:  Left heart catheterization, left ventriculogram,  coronary angiogram, PCI of the left circumflex, and OM, aortic arch  arteriogram.   OPERATOR:  Corky Crafts, MD   REFERRING:  Berton Mount, MD   INDICATIONS:  Non-ST-segment elevation myocardial infarction with  refractory chest pain and hypertension.   PROCEDURE NARRATIVE:  The risks and benefits of cardiac catheterization  were explained to the patient and informed consent was obtained.  She  was brought to the cath lab.  She was prepped and draped in the usual  sterile fashion.  A 6-French sheath was placed into the right femoral  artery using the modified Seldinger technique.  The right coronary  artery angiography was performed using a JR-4.0 catheter.  The catheter  was advanced to the vessel ostium under fluoroscopic guidance.  Digital  angiography was performed in multiple projections using hand injection  of contrast.  The left coronary artery angiography was performed using a  CLS 3.5 guiding catheter.  The catheter was then advanced to the vessel  ostium under fluoroscopic guidance.  Digital angiography was performed  in multiple projections using hand injection of contrast.  The PCI of  the circumflex system was then performed.  Subsequently, a pigtail  catheter was advanced to the ascending aorta and across the aortic  valve.  A power injection of contrast was performed to image the left  ventricle.  The catheter was then pulled back under continuous  hemodynamic pressure monitoring.  The catheter was withdrawn to the  aortic arch.  A power  injection of contrast was performed to image the  aortic arch and branch vessels which included particularly the left  subclavian.   FINDINGS:  The left main was widely patent.  There were mild  irregularities in the vessel.  The left circumflex is a medium-sized  vessel with mild luminal irregularities.  There is a small OM-1 which is  patent.  There is a small OM-2 with an ostial 95% stenosis.  The distal  circumflex just past the OM-2 had a thrombotic 99% lesion.  The left  anterior descending was a medium-sized vessel with moderate  atherosclerosis in the proximal LAD and a 70% stenosis in the mid  vessel.  There is a small first diagonal which was patent and had  moderate atherosclerosis.  There was a medium-sized second diagonal  which was patent.  The right coronary artery was a medium-sized dominant  vessel with moderate atherosclerosis in the proximal to midportion.  The  posterolateral branch was patent.  The PDA had a long 70-80% stenosis  with TIMI III flow.  The left ventriculogram showed normal ventricular  function.  The estimated ejection fraction was 60%.  The left  ventricular pressure was 106/3 with an LVEDP  of 10 mmHg.  Aortic  pressure of 107/55 with a mean aortic pressure of 76 mmHg.   PCI NARRATIVE:  CLS 3.5 guiding catheter was used.  A Prowater was  advanced across the lesion in the distal circumflex.  A BMW wire was  placed in the lesion in the second obtuse marginal.  A 2.0 x 15 apex  balloon was then placed over the Prowater wire in the distal circumflex.  The balloon was inflated to 8 atmospheres for 30 seconds and then to 10  atmospheres for 30 seconds.  A 2.0 x 15 apex was then advanced to the OM-  2 and inflated to 6 atmospheres for 15 seconds.  A 2.0 x 15-mm  miniVision stent was then advanced in the distal circumflex and deployed  at 9 atmospheres for 25 seconds.  The BMW was pulled out of the OM-2 and  then rewired into the OM-2 through a stent strut.   A 2.0 x 8 mm apex  balloon was then placed into the ostium of the OM-2 and deployed at 8  atmospheres for 30 seconds.  The aortic arch revealed mild  atherosclerosis in the distal portion of the arch.  The left subclavian  appeared occluded.   IMPRESSION:  1. Posterior infarct with thrombus in the distal circumflex and OM-2,      the distal circumflex was stented with a 2.0 x 15-mm miniVision      stent.  Percutaneous transluminal coronary angioplasty with 2.0 x 8      mm apex balloon of the obtuse marginal-2.  2. Residual left anterior descending and posterior descending artery      disease.  3. Normal left ventricular function.  4. Occluded left subclavian.   RECOMMENDATIONS:  The patient will continue aspirin and Plavix for at  least a month and up to a year if possible.  She should also stop  smoking and start taking lipid lowering drugs as well as beta blocker  and possibly ACE inhibitor.  I think her hypotension was likely a false  reading because of her left subclavian disease.  In my brief  conversation with her after the procedure, it does seem she has some  symptoms in regards to her left forearm.  She also has some  lightheadedness issues with difficult to sort out whether this occurs  when she is using her left arm.  Carotid-to-subclavian bypass should be  considered.  Consider PCI of LAD as well.      Corky Crafts, MD  Electronically Signed     JSV/MEDQ  D:  12/19/2007  T:  12/20/2007  Job:  [id number]

## 2010-12-23 NOTE — Op Note (Signed)
[name], [name]             ACCOUNT NO.:  [id number]   MEDICAL RECORD NO.:  [id number]          PATIENT TYPE:  AMB   LOCATION:  DAY                           FACILITY:  [facility]   PHYSICIAN:  R. Roetta Sessions, M.D. DATE OF BIRTH:  1951-05-08   DATE OF PROCEDURE:  06/30/2007  DATE OF DISCHARGE:                               OPERATIVE REPORT   PROCEDURE PERFORMED:  Diagnostic esophagogastroduodenoscopy.   INDICATIONS FOR PROCEDURE:  3-4 month history of epigastric pressure,  the gallbladder is out, EGD is now being done.  This approach has been  discussed with the patient at length.  The potential risks, benefits and  alternatives have been reviewed, questions answered, she is agreeable.  Please see documentation in the medical record.   PROCEDURE NOTE:  O2 saturation, blood pressure, pulse rate, and  respirations were monitored throughout entire procedure.  Conscious  sedation with Versed 3 mg IV and Demerol 75 mg IV in divided doses.  Instrument Pentax video chip system.  Cetacaine spray for topical  pharyngeal anesthesia.   FINDINGS:  Examination of the tubular esophagus revealed normal mucosa.  EG junction easily traversed.  The gastric cavity was empty and  insufflated well with air.  A thorough examination of the gastric mucosa  including retroflexion of the proximal stomach and esophagogastric  junction demonstrated normal mucosa.  The pylorus was patent and easily  traversed.  Examination of the bulb and second portion revealed no  abnormalities.   THERAPEUTIC/DIAGNOSTIC MANEUVERS PERFORMED:  None.   The patient tolerated the procedure well, was reacted in endoscopy.   IMPRESSION:  Normal esophagus, stomach, D1, and D2.   RECOMMENDATIONS:  Chem 20, CBC, abdominal pelvic CT with IV and oral  contrast to further evaluate this lady's somewhat unusual  symptomatology. Further recommendations to follow.      Jonathon Bellows, M.D.  Electronically Signed     RMR/MEDQ  D:  06/30/2007  T:  06/30/2007  Job:  [id number]   cc:   Dr. Sherryll Burger

## 2010-12-23 NOTE — Discharge Summary (Signed)
[name], [name]             ACCOUNT NO.:  [id number]   MEDICAL RECORD NO.:  [id number]          PATIENT TYPE:  INP   LOCATION:  6525                         FACILITY:  [facility]   PHYSICIAN:  Everardo Beals. Juanda Chance, MD, FACCDATE OF BIRTH:  03-Dec-1950   DATE OF ADMISSION:  12/17/2007  DATE OF DISCHARGE:  12/22/2007                               DISCHARGE SUMMARY   PROCEDURE:  1. Cardiac catheterization.  2. Coronary arteriogram.  3. Left ventriculogram.  4. Bare-metal stent to left circumflex/OM-2.  5. Arch aortogram.  6. Recatheterization with staged drug-eluting stent to the LAD.  7. Ultrasound of the right groin.   PRIMARY FINAL DISCHARGE DIAGNOSIS:  1. Non-ST segment elevation myocardial infarction, secondary diagnosis      occluded left subclavian seen on arch aortogram.  2. Preserved left ventricular function with an EF of 60% cath.  3. Multiple sclerosis.  4. Diet-controlled diabetes with a hemoglobin A1c of 6.2, this      admission.  5. Hyperlipidemia with total cholesterol of 226, triglycerides 170,      HDL 24, LDL 168, Zocor 40  added.  6. Abnormal TSH at 0.268 with a free T4 and T3 within normal limits.  7. History of gastroesophageal reflux disease/peptic ulcer disease.  8. Status post cholecystectomy.  9. History of morphine intolerance (psychosis).  10.Tobacco use.  11.Allergy or intolerance to Ambien.   Time of discharge 34 minutes.   HOSPITAL COURSE:  [name] is a 60 year old female with no previous  history of coronary artery disease but has multiple cardiac risk  factors.  She had substernal chest pain the night before admission and  came to the hospital.  Her initial cardiac enzymes were elevated, and  she was admitted for further evaluation.   Her peak CK-MB and troponin was 1490/183, troponin 21.59.  She had  recurrent pain and was taken to the cath lab on Dec 18, 2007.  The  cardiac catheterization showed an ostial 95% OM2, the distal circumflex  had a 99% thrombotic lesion.  The distal circumflex was treated with  PTCA and bare-metal stent and the OM2 was treated with PTCA.  An arch  aortogram was performed, which showed an occluded left subclavian.   It was felt that she needed a staged intervention to the LAD and [name] was taken back to the cath lab on Dec 21, 2007.  A drug-eluting  stent was inserted into the LAD reducing the stenosis from 90% to 0.  She was seen by cardiac rehab and given instructions on smoking  cessation.   [name] had some thigh pain and groin pain.  So, a duplex  ultrasound was performed which showed areas of large hematoma but no  definite pseudoaneurysms seen.  There were 2 small areas that appeared  to be vascularized lymph nodes.  [name] responded well to pain  control medications.  She was encouraged on smoking cessation and given  information on quitting.  She ambulated with cardiac rehab and had some  weakness because of her multiple sclerosis, but it was not felt that  physical therapy was indicated  at this time.  Dr. Juanda Chance evaluated [name] and felt that if her ultrasound was negative for  pseudoaneurysm, she could be safely discharged home and follow up as an  outpatient in [place].   DISCHARGE INSTRUCTIONS:  1. Her activity level is to be increased gradually.  2. She is to stick to a low-fat diabetic diet.  3. She is to call our office for any problems with the cath site.  4. She is to follow up with Dr. Andee Lineman on Jan 06, 2008, and with her      family physician, as needed.   DISCHARGE MEDICATIONS:  1. Aspirin 325 mg daily.  2. Plavix 75 mg a day.  3. Lipitor 40 mg a day.  4. Metoprolol 25 mg 1/2 tablet b.i.d.  5. Neurontin 600 mg b.i.d.  6. Nitroglycerin sublingual p.r.n.      Theodore Demark, PA-C      Bruce R. Juanda Chance, MD, [facility]  Electronically Signed    RB/MEDQ  D:  12/22/2007  T:  12/23/2007  Job:  [id number]   cc:   Kirstie Peri, MD

## 2010-12-23 NOTE — Assessment & Plan Note (Signed)
[facility]                          [facility] OFFICE NOTE   NAME:[name], [name]                    MRN:          [id number]  DATE:01/12/2008                            DOB:          11-20-1950    REFERRING PHYSICIAN:  Doreen Beam, MD   HISTORY OF PRESENT ILLNESS:  The patient is a 60 year old female with  history of multiple sclerosis and heavy tobacco use.  The patient was  recently admitted on Dec 17, 2007 to [facility] after transfer  from the [facility] ER with an acute non-ST-elevation myocardial  infarction.  The patient during the catheterization was found to  significant coronary artery disease and underwent bare-metal stent to  the left circumflex and second obtuse marginal branch.  She also had a  staged procedure with drug-eluting stent to the LAD several days later.  During catheterization, it was noted that she had an occluded left  subclavian artery.   The patient now presents for followup.  She is doing remarkably well.  She has some shortness of breath, which is rather chronic.  She  continues to smoke unfortunately 5 cigarettes a day.  She reports that  both at rest and on exertion, she has brief episodes of substernal chest  pain for which she takes occasional nitroglycerin, and she does report  prompt relief with this.  However, her chest pain is not reproducible  consistently on exertion, and sometimes she is able to walk a flight of  stairs or uphill without any tightness in the chest.  She was given high-  dose Lipitor during the last hospitalization and could not tolerate this  because of leg cramps.  The patient is currently not taking statin drug  therapy.   MEDICATIONS:  1. Neurontin 600 mg p.o. b.i.d.  2. Plavix 75 mg p.o. daily.  3. Aspirin 325 daily.  4. Lopressor 25 one tablet p.o. b.i.d.  5. Unasyn over the counter.   PHYSICAL EXAMINATION:  VITAL SIGNS:  Blood pressure 121/76 in the right  arm,  heart rate 73, and weight 110 pounds.  GENERAL:  Well-nourished female in no apparent distress.  HEENT:  Pupils isocoric.  Conjunctivae clear.  NECK:  Supple.  Normal carotid upstroke.  No carotid bruits.  I do not  hear a left subclavian bruit.  HEART:  Regular rate and rhythm with normal S1 and S2.  No murmurs,  rubs, or gallops.  ABDOMEN:  Soft, nontender.  No rebound or guarding.  Good bowel sounds.  EXTREMITY:  No cyanosis, clubbing, or edema.   PROBLEM LIST:  1. Status post non-ST-segment elevation myocardial infarction.      a.     Status post bare-metal stent to the left circumflex and       obtuse marginal 2.      b.     Status post staged drug-eluting stent placement to the left       anterior descending artery on Dec 17, 2007.      c.     Preserved left ventricular function.      d.     Occluded  left subclavian artery noted during       catheterization.  2. Multiple sclerosis.  3. Hoarseness, chronic.  4. Diet-controlled diabetes.  5. Dyslipidemia.  Intolerant of high-dose Lipitor.  6. 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  7. History of gastroesophageal reflux disease.  8. Status post cholecystectomy.  9. History of morphine intolerance.  10.Tobacco use.   PLAN:  1. The patient continues to have some chest pain, but is not always      reproducible on exertion.  I do suspect it could represent angina      from microvascular disease as the patient continues to smoke and      has relief from nitroglycerin.  I have prescribed her a low-dose      Imdur preparation of 30 mg p.o. daily.  I have told the patient if      she continues to have chest pain, further stress testing may be      indicated.  2. Aggressive risk factor modification with significant amount of time      spent counseling the patient regarding tobacco use.  I gave her an      1800 help line for her to quit tobacco.  I have also prescribed      Wellbutrin per the patient's request 150  mg p.o. b.i.d. and asked      her to further discuss with Dr. Sherryll Burger and get refills from him.  3. We also restarted the patient's statin drug therapy, but at lower      dose with close monitoring for myalgias.  4. The patient will follow up with [place] in 6 months.     Learta Codding, MD,FACC  Electronically Signed    GED/MedQ  DD: 01/12/2008  DT: 01/13/2008  Job #: [id number]   cc:   Doreen Beam, MD

## 2010-12-23 NOTE — Assessment & Plan Note (Signed)
NAME:  [name], [name]              CHART#:  [id number]   DATE:                                   DOB:  14-Mar-1951   OFFICE FOLLOWUP   Follow up periumbilical pain.   Patient has had periumbilical for a year.  It is insidiously worsening.  On at least two occasions, we have seen mildly elevated serum amylases  but normal lipases and LFTs.  Gallbladder is long gone (dyskinesia and  cholelithiasis).  Her biliary tree was not dilated on the most recent  CT.  A question of some proximal gastric wall thickening; however, it is  notable that just days earlier, she underwent an EGD where her stomach  was well seen and appeared normal.  EGD revealed a normal upper GI  tract.  Chem-20, CBC all looked good.   PAST MEDICAL HISTORY:  Significant for elevated transaminases back in  the late 90s.  Question of dilated common hepatic duct.  She underwent  an ERCP, but there is no significant dilation, and the duct drained  well.  There is no filling defect or other abnormality.   Her abdominal pain is incessant.  It waxes and wanes throughout the day.  It is not effected by eating or having a bowel movement (she is  constipated, but those symptoms are well controlled on MiraLax, as long  as she takes it).  Fasting does not ameliorate her symptoms.  She has  not had fevers or chills.   CURRENT MEDICATIONS:  See updated list.  She has a history of chronic  gastroesophageal reflux disease symptoms and has been on Prilosec 20 mg  orally daily for years.   ALLERGIES:  MORPHINE.   PHYSICAL EXAMINATION:  She appears in no acute distress.  Weight is 114.  Height is 5 foot 1/4 inch.  Temp 97.6, BP 130/82, pulse 72.  Skin:  Warm  and dry.  There is no jaundice.  Chest:  Lungs are clear to  auscultation.  Cardiac:  Regular rate and rhythm without murmur, rub or  gallop.  Abdomen:  Nondistended.  Positive bowel sounds.  Soft.  She  does have localized periumbilical tenderness to palpation.  No  appreciable mass or organomegaly.   ASSESSMENT:  Patient is a very pleasant 60 year old lady with a one  history of basically periumbilical pain, incessant.  It does wax and  wane.  It is not affected by eating or bowel function.  She is noted to  have mildly elevated amylase with normal LFTs and serum lipases.   The most recent CT failed to demonstrate any significant abnormality.  Recent findings of upper endoscopy also reassuring.   Not mentioned above, this nice lady does have a positive family history  of pancreatic cancer in her mother.  At this time, her symptoms are  somewhat nonspecific.  We will have to be concerned about the  possibility of sphincter of Oddi dysfunction or something going on in  her pancreas causing mild inflammation.  At this point, would  contemplate EUS versus further consideration of possible biliary  manometry.   RECOMMENDATIONS:  I told the patient the most efficient approach, would  go ahead and get her in to see one of the biliary physicians over at  [facility], as the workup to  the  extent that we can do here in [place] has been done.  She is  agreeable to go ahead.  Will go ahead and take the liberty of making the  referral.  Hopefully will get her in over the next 4-6 weeks.  Further  recommendations to follow.       Jonathon Bellows, M.D.  Electronically Signed     RMR/MEDQ  D:  08/19/2007  T:  08/19/2007  Job:  [id number]   cc:   Kirstie Peri, MD

## 2010-12-23 NOTE — Consult Note (Signed)
[name], [name]             ACCOUNT NO.:  [id number]   MEDICAL RECORD NO.:  [id number]          PATIENT TYPE:  AMB   LOCATION:  DAY                           FACILITY:  [facility]   PHYSICIAN:  R. Roetta Sessions, M.D. DATE OF BIRTH:  12-19-1950   DATE OF CONSULTATION:  DATE OF DISCHARGE:                                 CONSULTATION   HISTORY OF PRESENT ILLNESS:  [name] is a 60 year old Caucasian  female.  For almost a year now, she has had significant epigastric pain,  pressure and bloating.  She also complains of epigastric tenderness.  She has tried Maalox and TUMS previously.  She is now on Prilosec 20 mg  daily.  She tells me she cannot stand to wear a bra because she has  upper abdominal pain just below her rib cage all day long.  She has not  noticed a difference in her symptoms since she has been on Prilosec.  She does complain of anorexia or early satiety.  Her weight has steadily  increased.  She does have transient nausea but denies any vomiting.  She  tells me she has eat slowly given her history of MS  She is taking small  bites, rarely has choking spells, her bowels are moving okay.  She  occasionally gets constipated, but has had good response with fiber and  stool softeners.  She generally goes between generally goes between 2-3  days between bowel movements.   PAST MEDICAL AND SURGICAL HISTORY:  1. Colonoscopy by Dr. Gabriel Cirri on September 15, 2005.  She was found to      have a sessile polyp in the proximal transverse colon which was      removed and an inflamed hyperplastic polyp.  She had otherwise      normal colonoscopy.  2. She was diagnosed with multiple sclerosis in 1992.  She has been      off injections for about 6 months now.  She is followed by Dr.      Orlin Hilding in [place].  3. History of depression.  4. Chronic GERD.  5. Osteoporosis.   PAST SURGICAL HISTORY:  1. ERCP by Dr. Jena Gauss on September 07, 1997, for dilated intrahepatic      ducts which  was normal.  2. Status post cholecystectomy in October 1998 for symptomatic      cholelithiasis and biliary dyskinesia.  3. Status post tonsillectomy.  4. Right wrist surgery.  5. Hysterectomy.   CURRENT MEDICATIONS:  1. Neurontin 600 mg daily.  2. Cymbalta 60 mg daily.  3. Prilosec 20 mg daily.  4. TUMS 1-2 daily p.r.n.  5. Stool softeners 1-2 daily p.r.n.   ALLERGIES:  MORPHINE.   FAMILY HISTORY:  Mother deceased at age 91 with history of pancreatic  and metastatic colon cancer.  Father deceased at age 42 with history of  prostate.   COMPLICATIONS:  She has two healthy sisters.   SOCIAL HISTORY:  [name] is married.  She has two healthy children.  She is currently disabled.  She has a 30-pack year history of tobacco  use.  She consumes  an alcoholic beverage a month.  Denies any drug use.   REVIEW OF SYSTEMS:  See HPI, otherwise negative.   PHYSICAL EXAMINATION:  VITAL SIGNS: Weight 116, height 62 inches,  temperature 98.9, blood pressure 120/70, pulse 64.  GENERAL:  [name] is a well-developed, well-nourished Caucasian  female in no acute distress.  HEENT:  Sclerae are clear, nonicteric.  Conjunctivae pink.  Oropharynx  pink, moist without lesions.  NECK:  Supple without any mass or thyromegaly.  CHEST:  Heart regular rate and rhythm with normal S1, S2 without  murmurs, clicks, rubs or gallops.  LUNGS:  Clear to auscultation bilaterally.  ABDOMEN:  Positive bowel sounds x4.  No bruits auscultated.  Soft,  nondistended.  She does have mild epigastric tenderness and left upper  quadrant tenderness on deep palpation.  There is no rebound tenderness  or guarding.  No hepatosplenomegaly or mass.  EXTREMITIES:  Without clubbing or edema bilaterally.  SKIN:  Pink, warm and dry without any rash or jaundice.   IMPRESSION:  [name] is a 60 year old female with refractory GERD  symptoms and epigastric pressure and tenderness.  She is going to  require further  evaluation to rule out complicated gastroesophageal  reflux disease including esophagitis/gastritis or peptic ulcer disease.  She has not responded well to PPI thus far.  Depending findings would be  concern for sphincter of Oddi dysfunction, and we may need to obtain  LFTs and pursue work up further if EGD is benign.   PLAN:  1. EGD with Dr. Jena Gauss in the near future.  I discussed the procedures      including risks and benefits which include but are not limited to      bleeding, infection, perforation, drug reaction.  She agrees with      plan and consent will be      obtained.  2. Continue Prilosec 20 mg daily for now.  .   I would like to thank Dr. Sherryll Burger for allowing [place] to participate in the  care of [name].      Lorenza Burton, N.P.      Jonathon Bellows, M.D.  Electronically Signed    KJ/MEDQ  D:  06/24/2007  T:  06/25/2007  Job:  [id number]   cc:   Kirstie Peri, MD  Fax: [phone]

## 2010-12-23 NOTE — Cardiovascular Report (Signed)
NAMEETHERINE, [name]             ACCOUNT NO.:  [id number]   MEDICAL RECORD NO.:  [id number]          PATIENT TYPE:  INP   LOCATION:  6525                         FACILITY:  [facility]   PHYSICIAN:  Everardo Beals. Juanda Chance, MD, FACCDATE OF BIRTH:  1951-06-18   DATE OF PROCEDURE:  12/21/2007  DATE OF DISCHARGE:                            CARDIAC CATHETERIZATION   CLINICAL HISTORY:  [name] is 60 years old and was admitted 3 days  ago with an inferior infarction, underwent stenting of the distal  circumflex artery by Dr. Eldridge Dace.  This was a bifurcation lesion and a  bare-metal stent was used along with balloon angioplasty for the side  branch.  She had a high-grade lesion in the LAD, and we brought her back  today for intervention on the LAD.   PROCEDURE:  The procedure was performed with the right femoral artery  arterial sheath and a 6-French Q-3.5 guiding catheter with side holes.  The patient was given antiemetic bolus infusion and had been on Plavix  and was given 4 chewable aspirin this morning.  We passed a Prowater  wire down the LAD across the lesion without difficulty.  We direct-  stented with a 2.5 x 18-mm PROMUS stent, deploying this with 1 inflation  of 12 atmospheres for 30 seconds.  We then postdilated with a 2.75 x 15-  mm Quantum Maverick, deploying 1 inflation of 16 atmospheres for 30  seconds.  Final diagnostics were then performed through the guiding  catheter.  Right femoral artery was closed with Angio-Seal at the end of  procedure.  The patient tolerated the procedure well and left the  laboratory in satisfactory condition with good angiographic results.  The stenosed and proximal LAD was estimated at 90%.  Following stenting,  this improved to 0%.   CONCLUSION:  Successful percutaneous coronary intervention of the lesion  in the mid left anterior descending with improvement of central  narrowing from 90% to 0%.   DISPOSITION:  The patient was returned to post  angio for further  observation.      Bruce Elvera Lennox Juanda Chance, MD, [facility]  Electronically Signed     BRB/MEDQ  D:  12/21/2007  T:  12/22/2007  Job:  [id number]   cc:   Cardiopulmonary Lab

## 2010-12-23 NOTE — H&P (Signed)
[name], [name] NO.:  [id number]   MEDICAL RECORD NO.:  [id number]          PATIENT TYPE:  INP   LOCATION:  2304                         FACILITY:  [facility]   PHYSICIAN:  Unice Cobble, MD     DATE OF BIRTH:  1951-05-31   DATE OF ADMISSION:  12/17/2007  DATE OF DISCHARGE:                              HISTORY & PHYSICAL   CHIEF COMPLAINT:  Chest pain. This is a 60 year old white female with a  history of diet-controlled hyperlipidemia and diet-controlled diabetes  with a 35-year pack smoking history who presents with chest pain.  The  patient had chest pain at 2:30 last night with substernal pain that was  elephant-like in nature with shortness of breath and palpitations.  Pain was relieved by nitroglycerin at the outside hospital.  Her  troponin was positive.  No EKG changes.  No history of similar pain.  No  history of PND, orthopnea, or lower extremity edema.  ADLs are excellent  currently but can be quite poor at times when her multiple sclerosis  flares.   PAST MEDICAL HISTORY:  1. Multiple sclerosis.  2. Diabetes mellitus, diet controlled.  3. Hyperlipidemia, diet controlled.  4. History of GERD and peptic ulcer disease.  5. Status post cholecystectomy.   ALLERGIES:  MORPHINE causes psychotic-like episodes.   MEDICATIONS:  1. Neurontin 600 mg b.i.d.  2. Multivitamin.   SOCIAL HISTORY:  She lives in [place] with her husband.  She is on  disability from her multiple sclerosis.  She has a 35-pack year history  and has cut down to 1/2-pack per day currently.  Rare alcohol.  No  drugs.   FAMILY HISTORY:  No coronary artery disease.  Her half sister had a  stroke.   REVIEW OF SYSTEMS:  Complete review of systems was performed and  otherwise found to be negative except as stated in the HPI.   PHYSICAL EXAMINATION:  VITAL SIGNS:  She is afebrile with a pulse of 60  and a blood pressure 119/57.  Respiratory rate is 12.  O2 sats are 99%  on 2 liters.  GENERAL:  She is a thin white female in no acute distress.  HEENT: Shows PERRLA, EOMI, MMM.  Good dentition, oropharynx without  erythema or exudate.  NECK:  Supple without lymphadenopathy, thyromegaly, bruits or jugular  venous distention.  HEART:  Regular rate and rhythm without murmurs, gallops or rubs.  S1  and S2 are normal.  Pulses are 2+ and equal without bruits.  LUNGS:  Clear to auscultation bilaterally.  SKIN:  No rashes or lesions.  ABDOMEN:  Soft and nontender with normal bowel sounds.  No rebound or  guarding.  No hepatosplenomegaly.  EXTREMITIES:  Show no cyanosis, clubbing or edema.  MUSCULOSKELETAL:  Shows no joint deformity, effusions or spine or CVA  tenderness.  NEUROLOGICAL:  She is alert and oriented x3.  Cranial nerves 2-12  grossly intact.  Strength is 5/5 in all extremities and axial groups  with normal sensation throughout.   EKG shows a rate of 77 in normal sinus rhythm.  There are no ST or  ischemic changes.  Labs show creatinine of 0.8.  CBC is not back yet.  BNP is 13.  Troponin is 0.8 with a CK-MB of 38.7.   ASSESSMENT/PLAN:  This is a 60 year old white female with a history of  smoking and borderline diabetes and hyperlipidemia who presents with non-  ST-elevation myocardial infarction.  I will plan for cardiac  catheterization on Monday.  She will be anticoagulated with Integrillin,  Lovenox, and aspirin until then.  Beta-blocker and statin have been  started.  She does have a history of increased LFTs in the chart, and I  will check these to help with statin administration.  Check hemoglobin  A1c and lipids.      Unice Cobble, MD  Electronically Signed     ACJ/MEDQ  D:  12/17/2007  T:  12/17/2007  Job:  [id number]

## 2010-12-26 NOTE — Discharge Summary (Signed)
[place]  Patient:    [name]                     MRN: [id number] Adm. Date:  [id number] Disc. Date: 06/16/99 Attending:  Talitha Givens Dictator:   Delton See, P.A. CC:         Wyvonnia Lora, M.D., [address], [place], [place] [address]             Santina Evans A. Orlin Hilding, M.D.                           Discharge Summary  HISTORY ON ADMISSION:  [name] is a 60 year old female with no known coronary artery disease, who had the onset of substernal chest pressure on the a.m. of admission.  The pain was described as a 8 or a 9 on a 1/10 scale, radiated into the left chest, associated with shortness of breath and nausea and vomiting, as well as left arm weakness.  There was no diaphoresis.  It lasted 5 to 10 minutes.  She ad more than 10 episodes on the day of admission.  She had two episodes the previous week but not as severe.  The pain increased with changes in position but did not change with deep inspiration.  There was some chest wall tenderness.  The patient did not receive any nitroglycerin.  She went to [facility], where he did receive aspirin, nitroglycerin paste, and was started on a heparin drip. She had three episodes of pain after the medications were started, although these were not as severe.  She was transferred to [facility] for further evaluation.  PAST MEDICAL HISTORY:  Multiple sclerosis with neuropathic pain x 7 years.  She has diabetes, which is diet controlled.  Positive family history of coronary artery  disease.  Positive tobacco history.  History of bronchitis and remote history of peptic ulcer disease.  She is status post total abdominal hysterectomy and cholecystectomy.  SOCIAL HISTORY:  The patient has a greater than 35-pack-year history of tobacco. She is married.  She is disabled.  She does not use alcohol.  She has two children. She has been under a lot of  stress recently.  ALLERGIES:  No known drug allergies.  CURRENT MEDICATIONS: 1. Betaseron 0.3 mg every other day. 2. Neurontin 600 mg t.i.d. 3. Ditropan 10 mg b.i.d.  HOSPITAL COURSE:  As noted, this patient was admitted to [facility] for further evaluation of chest pain.  Her CK-MB enzymes were negative.  She was found to have a low potassium level of 2.9 and this was supplemented.  She was also anemic with hemoglobin 10.7 and hematocrit 29.9.  The patient was scheduled for an adenosine Cardiolite study, which was performed on June 16, 1999.  This was interpreted as having no ischemia with an EF of 68%. It was not felt that her chest pain had a cardiac etiology and arrangements were made to discharge the patient later that day.  Prior to discharge, the patient requested a prescription for something to help er sleep, stating that she only sleeps about three to four hours per night and has  been exhausted.  She states that she has tried Ambien in the past without success. She was given a prescription for Restoril 15 mg q.h.s. p.r.n. #20 with no refills.  LABORATORY DATA:  As noted, the patients potassium level was 2.9.  She was given a total of 60 mEq of K-Dur prior to discharge on June 16, 1999.  A cholesterol  profile revealed cholesterol to be elevated at 227, triglycerides 171, HDL 63, DL 045.  Cardiac enzymes were negative.  A liver function profile was normal except for albumin of 3.1  CBC revealed hemoglobin 10.7, hematocrit 29.9, WBC 9000, platelets 240,000.  Chemistries:  BUN 27, creatinine 1.4, potassium 2.9, sodium  140, chloride 105, CO2 27, glucose 100.  There is currently no chest x-ray or EKG in the chart.  DISCHARGE MEDICATIONS:  1. Ditropan 10 mg b.i.d.  2. Neurontin 600 mg t.i.d.  3. Betaseron 0.3 mg every other day.  4. Restoril 15 mg q.h.s. p.r.n. #20 no refills.  DISCHARGE INSTRUCTIONS:  Activities to be as tolerated.  The  patient is told to  stay on a low salt/low fat diet.  She is told to try to quit smoking.  She was o have a potassium level and a CBC at her next office visit with Dr. Margo Common.  She was told to call the office tomorrow for an appointment later this week or next week. She was to follow up with Dr. Myrtis Ser only as needed.  Dr. Hillery Jacks office was called with the results of her stress test, as well as the above-noted laboratory abnormalities.  PROBLEM LIST AT TIME OF DISCHARGE:  1. Chest pain with negative cardiac enzymes.  2. Adenosine Cardiolite performed June 16, 1999, negative for ischemia with an     ejection fraction of 68%.  3. Tobacco history.  4. Multiple sclerosis.  5. Diabetes mellitus, diet controlled.  6. Remote history of peptic ulcer disease.  7. Hypokalemia, potassium 2.9, supplemented.  8. Mildly elevated BUN.  9. Anemia; hemoglobin 10.7, hematocrit 29.9. 10. Elevated cholesterol levels. 11. Increased stress. 12. Insomnia. DD:  06/16/99 TD:  06/16/99 Job: [id number]

## 2011-05-19 LAB — COMPREHENSIVE METABOLIC PANEL
ALT: 14
AST: 18
Alkaline Phosphatase: 76
Calcium: 9.1
GFR calc Af Amer: >60
Potassium: 4.2
Sodium: 140
Total Protein: 6.3

## 2011-05-19 LAB — DIFFERENTIAL
Basophils Absolute: 0
Basophils Relative: 1
Eosinophils Absolute: 0.1 — ABNORMAL LOW
Eosinophils Relative: 2
Monocytes Absolute: 0.4

## 2011-05-19 LAB — CBC
HCT: 38.5
Hemoglobin: 13
MCHC: 33.8
MCV: 83.7
Platelets: 256
RDW: 13.9

## 2011-11-26 IMAGING — CR DG CHEST 1V PORT
1 series · 1 of 1 positions shown · non-contrast
Comparison: 09/27/2010

CLINICAL DATA: Status post CABG

PORTABLE CHEST - 1 VIEW

[AP]
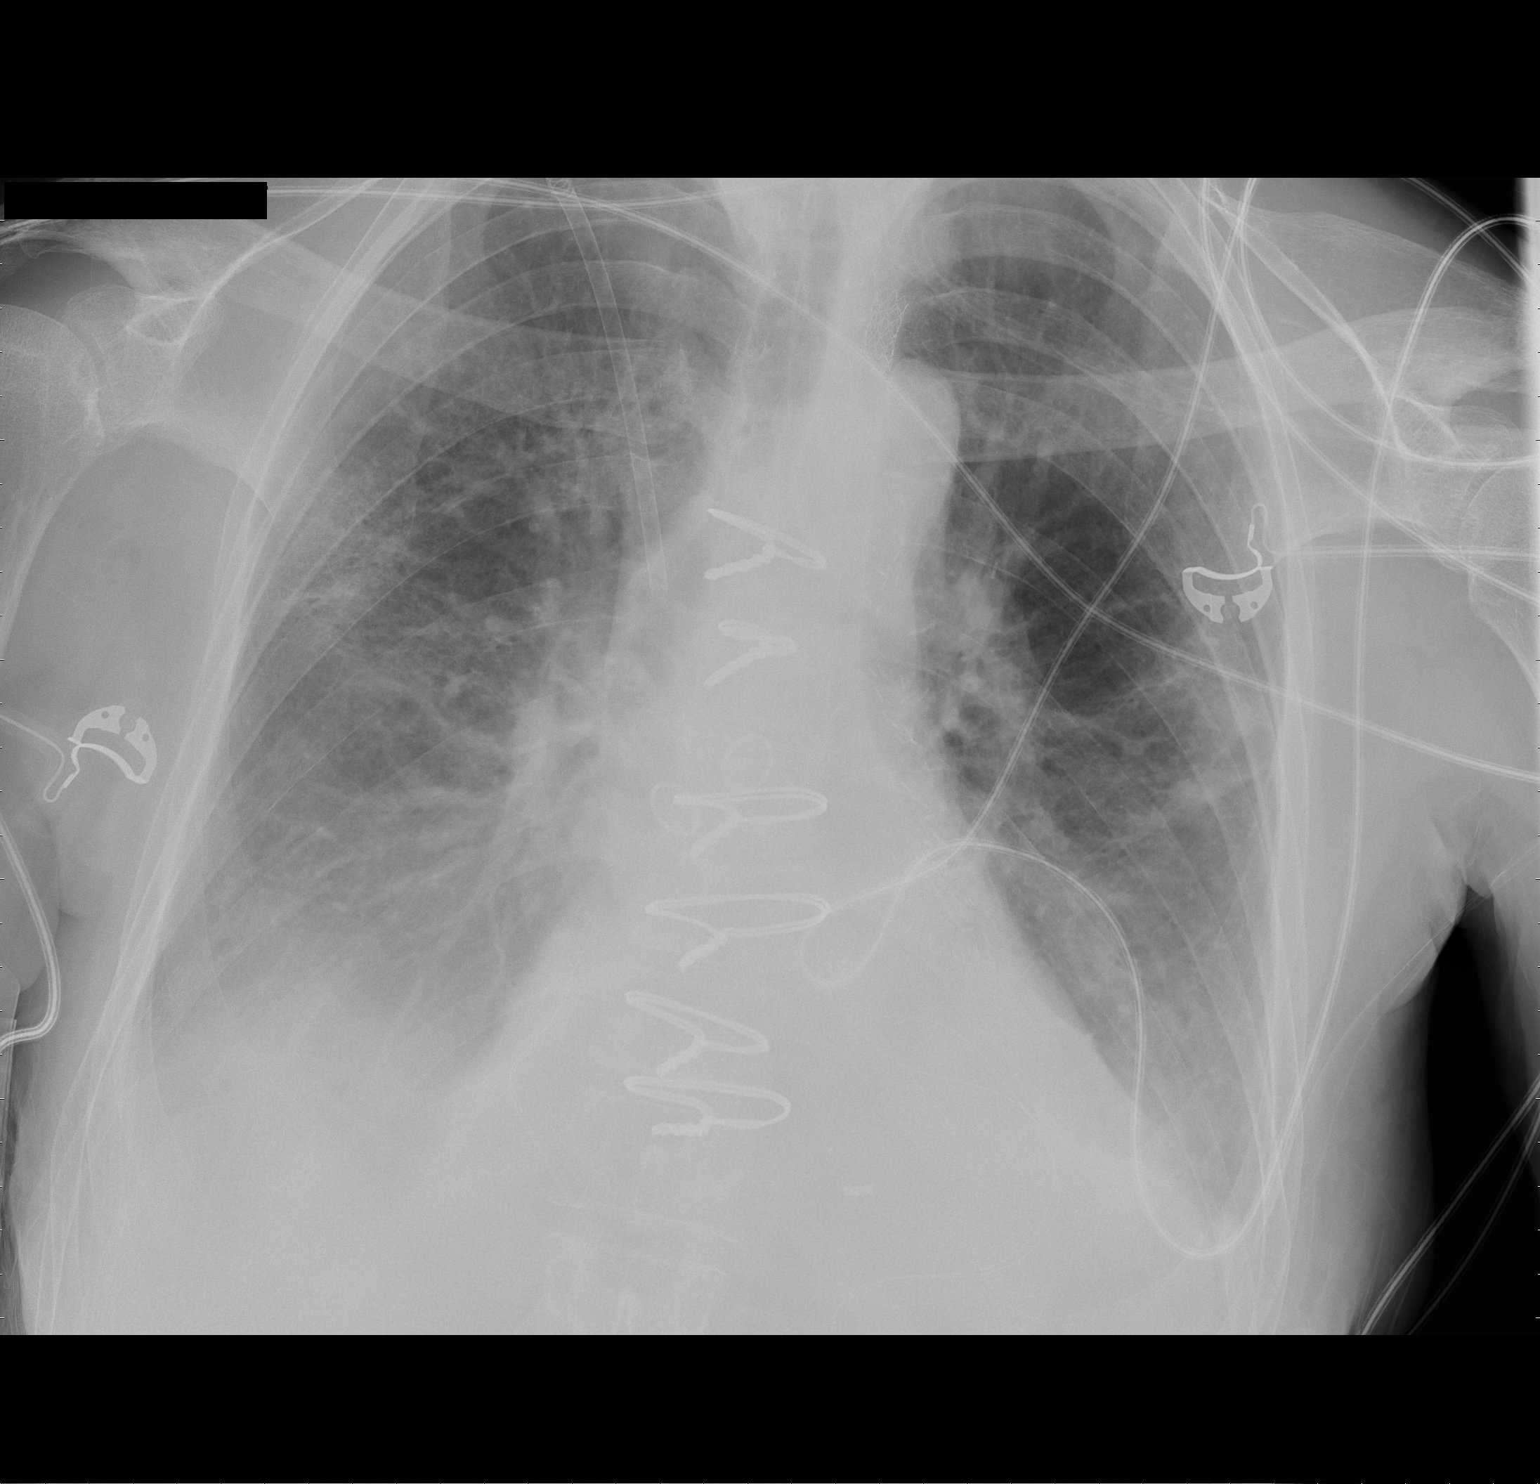

[1 of 1 positions shown; findings below may reference images not displayed]

FINDINGS: Right IJ sheath remains in place with its tip over the
superior SVC.  The IJ Swan-Ganz catheter has been removed.
Mediastinal drain and chest tube have been removed.  No
pneumothorax.  Mild enlargement cardiomediastinal silhouette
persists with bilateral lower lobe hazy opacification likely
related to posteriorly tracking small pleural effusions.
IMPRESSION: No pneumothorax after removal of support apparatus as described
above.

Stable posteriorly tracking pleural effusions.

## 2012-02-25 ENCOUNTER — Encounter: Payer: Self-pay | Admitting: Cardiology

## 2012-11-03 ENCOUNTER — Other Ambulatory Visit: Payer: Self-pay | Admitting: Neurological Surgery

## 2012-11-24 ENCOUNTER — Encounter (HOSPITAL_COMMUNITY): Payer: Self-pay

## 2012-11-27 ENCOUNTER — Encounter: Payer: Self-pay | Admitting: Cardiology

## 2012-11-30 ENCOUNTER — Encounter (HOSPITAL_COMMUNITY)
Admission: RE | Admit: 2012-11-30 | Discharge: 2012-11-30 | Disposition: A | Discharge status: Home / Self Care | Payer: Medicare Other | Source: Ambulatory Visit | Attending: Neurological Surgery | Admitting: Neurological Surgery

## 2012-11-30 ENCOUNTER — Encounter: Payer: Self-pay | Admitting: Cardiology

## 2012-11-30 ENCOUNTER — Ambulatory Visit (INDEPENDENT_AMBULATORY_CARE_PROVIDER_SITE_OTHER): Payer: Medicare Other | Admitting: Cardiology

## 2012-11-30 ENCOUNTER — Encounter (HOSPITAL_COMMUNITY): Payer: Self-pay

## 2012-11-30 VITALS — BP 91/58 | HR 58 | Ht 61.0 in | Wt 96.1 lb

## 2012-11-30 DIAGNOSIS — Z01812 Encounter for preprocedural laboratory examination: Secondary | ICD-10-CM | POA: Insufficient documentation

## 2012-11-30 DIAGNOSIS — Z01818 Encounter for other preprocedural examination: Secondary | ICD-10-CM | POA: Insufficient documentation

## 2012-11-30 DIAGNOSIS — I252 Old myocardial infarction: Secondary | ICD-10-CM | POA: Insufficient documentation

## 2012-11-30 DIAGNOSIS — F17200 Nicotine dependence, unspecified, uncomplicated: Secondary | ICD-10-CM | POA: Insufficient documentation

## 2012-11-30 DIAGNOSIS — K219 Gastro-esophageal reflux disease without esophagitis: Secondary | ICD-10-CM | POA: Insufficient documentation

## 2012-11-30 DIAGNOSIS — Z01810 Encounter for preprocedural cardiovascular examination: Secondary | ICD-10-CM | POA: Insufficient documentation

## 2012-11-30 DIAGNOSIS — E119 Type 2 diabetes mellitus without complications: Secondary | ICD-10-CM | POA: Insufficient documentation

## 2012-11-30 DIAGNOSIS — I2510 Atherosclerotic heart disease of native coronary artery without angina pectoris: Secondary | ICD-10-CM | POA: Insufficient documentation

## 2012-11-30 DIAGNOSIS — G458 Other transient cerebral ischemic attacks and related syndromes: Secondary | ICD-10-CM

## 2012-11-30 DIAGNOSIS — E785 Hyperlipidemia, unspecified: Secondary | ICD-10-CM

## 2012-11-30 LAB — CBC WITH DIFFERENTIAL/PLATELET
Basophils Absolute: 0 10*3/uL (ref 0.0–0.1)
Basophils Relative: 0 % (ref 0–1)
Eosinophils Absolute: 0.2 10*3/uL (ref 0.0–0.7)
Eosinophils Relative: 3 % (ref 0–5)
HCT: 37.2 % (ref 36.0–46.0)
Lymphocytes Relative: 30 % (ref 12–46)
MCH: 28.3 pg (ref 26.0–34.0)
MCHC: 34.7 g/dL (ref 30.0–36.0)
MCV: 81.6 fL (ref 78.0–100.0)
Monocytes Absolute: 0.5 10*3/uL (ref 0.1–1.0)
Platelets: 232 10*3/uL (ref 150–400)
RDW: 14.3 % (ref 11.5–15.5)

## 2012-11-30 LAB — BASIC METABOLIC PANEL
BUN: 7 mg/dL (ref 6–23)
CO2: 28 mEq/L (ref 19–32)
Calcium: 9.7 mg/dL (ref 8.4–10.5)
Chloride: 96 mEq/L (ref 96–112)
Creatinine, Ser: 0.68 mg/dL (ref 0.50–1.10)
Glucose, Bld: 79 mg/dL (ref 70–99)

## 2012-11-30 LAB — SURGICAL PCR SCREEN
MRSA, PCR: NEGATIVE
Staphylococcus aureus: NEGATIVE

## 2012-11-30 NOTE — Progress Notes (Signed)
Pt scheduled to see cardiologist today for pre surgery clearance

## 2012-11-30 NOTE — Assessment & Plan Note (Signed)
No active symptoms status post previous stenting.

## 2012-11-30 NOTE — Assessment & Plan Note (Signed)
The patient is clinically stable following CABG just over 2 years ago. No angina or CHF symptoms. ECG is also stable. Do not anticipate any further ischemic testing at this time, and expect that she can proceed with planned lumbar fusion at an acceptable perioperative cardiac risk. She will be holding aspirin for this surgery. Our cardiology service can be consulted if needed while the patient is hospitalized.

## 2012-11-30 NOTE — Assessment & Plan Note (Signed)
On statin therapy, followed by Dr. Sherryll Burger. Goal LDL should be under 100, close to 70 if possible.

## 2012-11-30 NOTE — Assessment & Plan Note (Signed)
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 Continue medical therapy and observation. Anticipate an annual followup cycle at this point. She sees Dr. Sherryll Burger on a regular basis.

## 2012-11-30 NOTE — Progress Notes (Signed)
Clinical Summary Ms. Mcdanel is a 62 y.o.female referred to the office for preoperative consultation. She is a former patient of Dr. Andee Lineman, last seen in March 2012. She is being considered for lumbar fusion by Dr. Yetta Barre. This is my first meeting with her in the office.  She is here with her husband. She denies any angina, no progressive shortness of breath. Main limitation is back pain. She uses a cane when she walks. Denies any falls recently. Describes activities consistent with at least 4 METS.  Cardiac history is reviewed below with revascularization surgery just over 2 years ago. Her ECG today shows normal sinus rhythm, no significant ST changes.   Allergies  Allergen Reactions  . Morphine Sulfate     REACTION: ALTERED MENTAL STATUS  . Sulfa Antibiotics Nausea And Vomiting  . Zolpidem Tartrate     Nightmares, makes her angry    Current Outpatient Prescriptions  Medication Sig Dispense Refill  . aspirin EC 325 MG tablet Take 325 mg by mouth daily.      . B Complex Vitamins (VITAMIN B COMPLEX PO) Take 1 tablet by mouth daily.       . Calcium Citrate (CITRACAL PO) Take 1 Units by mouth daily. Chocolate chewable      . Cyanocobalamin (VITAMIN B 12 PO) Take 1 tablet by mouth daily.      . DULoxetine (CYMBALTA) 60 MG capsule Take 60 mg by mouth at bedtime.      . gabapentin (NEURONTIN) 600 MG tablet Take 600 mg by mouth at bedtime.       . metoprolol tartrate (LOPRESSOR) 25 MG tablet Take 25 mg by mouth 2 (two) times daily.      . nitroGLYCERIN (NITROSTAT) 0.4 MG SL tablet Place 0.4 mg under the tongue every 5 (five) minutes as needed for chest pain.       Marland Kitchen oxyCODONE (OXY IR/ROXICODONE) 5 MG immediate release tablet Take 5 mg by mouth every 6 (six) hours as needed for pain.      . pantoprazole (PROTONIX) 40 MG tablet Take 40 mg by mouth 2 (two) times daily.      . simvastatin (ZOCOR) 20 MG tablet Take 20 mg by mouth every evening.      . traZODone (DESYREL) 150 MG tablet Take 150  mg by mouth at bedtime.      Marland Kitchen VITAMIN D, CHOLECALCIFEROL, PO Take 1 tablet by mouth daily.      . vitamin E 100 UNIT capsule Take 100 Units by mouth daily.       No current facility-administered medications for this visit.    Past Medical History  Diagnosis Date  . Mixed hyperlipidemia   . MI (myocardial infarction)   . MS (multiple sclerosis)   . Type 2 diabetes mellitus   . Chronic hoarseness   . GERD (gastroesophageal reflux disease)   . Subclavian vein stenosis, left     Ststus post stenting - Dr. Excell Seltzer  . Coronary atherosclerosis of native coronary artery     BMS circumflex, DES LAD, CABG 2012    Past Surgical History  Procedure Laterality Date  . Coronary artery bypass graft  February 2012    LIMA to LAD, SVG to OM, SVG to RCA and PDA  . Cholecystectomy    . Coronary angioplasty    . Tonsillectomy    . Wrist surgery      Family History  Problem Relation Age of Onset  . Cancer Father   . Cancer Mother  Social History Ms. Jablonski reports that she has been smoking Cigarettes.  She has been smoking about 0.00 packs per day. She does not have any smokeless tobacco history on file. Ms. Casares reports that  drinks alcohol.  Review of Systems No palpitations or dizziness. No syncope. Stable appetite. No reported major bleeding episodes. Reports tolerating her cardiac medications. No nitroglycerin use. Otherwise negative.  Physical Examination Filed Vitals:   11/30/12 1446  BP: 91/58  Pulse: 58   Filed Weights   11/30/12 1446  Weight: 96 lb 1.9 oz (43.6 kg)   Thin woman in no acute distress. HEENT: Conjunctiva and lids normal, oropharynx clear. Neck: Supple, no elevated JVP or carotid bruits, no thyromegaly. Lungs: Clear to auscultation, nonlabored breathing at rest. Cardiac: Regular rate and rhythm, no S3 or significant systolic murmur, no pericardial rub. Thorax: Well-healed sternal incision. Abdomen: Soft, nontender, bowel sounds  present. Extremities: No pitting edema, distal pulses 2+. Skin: Warm and dry. Musculoskeletal: No kyphosis. Neuropsychiatric: Alert and oriented x3, affect grossly appropriate.   Problem List and Plan   Preoperative cardiovascular examination The patient is clinically stable following CABG just over 2 years ago. No angina or CHF symptoms. ECG is also stable. Do not anticipate any further ischemic testing at this time, and expect that she can proceed with planned lumbar fusion at an acceptable perioperative cardiac risk. She will be holding aspirin for this surgery. Our cardiology service can be consulted if needed while the patient is hospitalized.  CORONARY ATHEROSCLEROSIS NATIVE CORONARY ARTERY Multivessel disease status post CABG in February 2012. Continue medical therapy and observation. Anticipate an annual followup cycle at this point. She sees Dr. Sherryll Burger on a regular basis.  HYPERLIPIDEMIA-MIXED On statin therapy, followed by Dr. Sherryll Burger. Goal LDL should be under 100, close to 70 if possible.  SUBCLAVIAN STEAL SYNDROME No active symptoms status post previous stenting.    Jonelle Sidle, M.D., F.A.C.C.

## 2012-11-30 NOTE — Pre-Procedure Instructions (Signed)
[name]  11/30/2012   Your procedure is scheduled on:  Dec 08, 2012  Report to [facility] at 9 AM.  Call this number if you have problems the morning of surgery: [phone]   Remember:   Do not eat food or drink liquids after midnight.   Take these medicines the morning of surgery with A SIP OF WATER: DULoxetine (CYMBALTA) 60 MG capsule, gabapentin (NEURONTIN) 600 MG tablet, metoprolol tartrate (LOPRESSOR) 25 MG tablet, nitroGLYCERIN (NITROSTAT) 0.4 MG SL tablet, oxyCODONE (OXY IR/ROXICODONE) 5 MG immediate release tablet, pantoprazole (PROTONIX) 40 MG tablet   Do not wear jewelry, make-up or nail polish.  Do not wear lotions, powders, or perfumes. You may wear deodorant.  Do not shave 48 hours prior to surgery. Men may shave face and neck.  Do not bring valuables to the hospital.  Contacts, dentures or bridgework may not be worn into surgery.  Leave suitcase in the car. After surgery it may be brought to your room.  For patients admitted to the hospital, checkout time is 11:00 AM the day of  discharge.   Patients discharged the day of surgery will not be allowed to drive  home.  Name and phone number of your driver:   Special Instructions: Shower using CHG 2 nights before surgery and the night before surgery.  If you shower the day of surgery use CHG.  Use special wash - you have one bottle of CHG for all showers.  You should use approximately 1/3 of the bottle for each shower.   Please read over the following fact sheets that you were given: Pain Booklet, Coughing and Deep Breathing, Blood Transfusion Information and Surgical Site Infection Prevention

## 2012-11-30 NOTE — Patient Instructions (Signed)
Continue all current medications. Your physician wants you to follow up in:  1 year.  You will receive a reminder letter in the mail one-two months in advance.  If you don't receive a letter, please call our office to schedule the follow up appointment   

## 2012-12-01 NOTE — Progress Notes (Signed)
Anesthesia chart review: Patient is a 62 year old female scheduled for L5-S1 PLIF by Dr. Yetta Barre on 12/08/2012.  History includes smoking, CAD/MI s/p LAD/Cx stent '09 and CABG (LIMA to LAD, SVG to [facility] and PDA, SVG to OM2) 09/2010, diabetes mellitus type 2, hyperlipidemia, multiple sclerosis, chronic hoarseness, GERD, left subclavian vein stenosis, tonsillectomy, cholecystectomy. PCP is Dr. Sherryll Burger.  She is newly established with Cardiologist Dr. Diona Browner.  He saw her on 11/30/2012 for a preoperative evaluation. He did not recommend any further ischemic testing at this time.  He felt she can proceed with planned lumbar fusion at an "acceptable perioperative cardiac risk."  EKG on 11/30/12 showed NSR.  Intra-op (CABG) TEE on 09/26/10 showed normal left ventricular systolic function. Possibly a very small atrial septal defect.  2D echo on 09/23/10 showed normal left ventricular systolic function with estimated EF 55-60%. Normal wall motion. Normal diastolic function. Trivial aortic regurgitation. Mild mitral regurgitation. There was increased thickness of the atrial septum, consistent with lipomatous hypertrophy.  Her last cardiac cath was on 09/22/10 prior to CABG.  Chest x-ray on 11/30/2012 showed chronic interstitial markings. No focal consolidation. Postsurgical changes related to prior CABG. Mild degenerative changes of the thoraco-lumbar spine. Cholecystectomy clips. No evidence of acute cardiopulmonary disease.  Preoperative labs noted.  She had recent evaluation by her cardiologist and was felt acceptable CV risk.  If no acute change in her status then would anticipate she could proceed as planned.  Velna Ochs [facility]/Anesthesiology Phone [phone] 12/01/2012 11:45 AM

## 2012-12-07 MED ORDER — CEFAZOLIN SODIUM-DEXTROSE 2-3 GM-% IV SOLR
2.0000 g | INTRAVENOUS | Status: AC
  Administered 2012-12-08: 2 g via INTRAVENOUS
  Filled 2012-12-07: qty 50

## 2012-12-08 ENCOUNTER — Encounter (HOSPITAL_COMMUNITY): Payer: Self-pay | Admitting: Vascular Surgery

## 2012-12-08 ENCOUNTER — Inpatient Hospital Stay (HOSPITAL_COMMUNITY): Payer: Medicare Other

## 2012-12-08 ENCOUNTER — Encounter (HOSPITAL_COMMUNITY): Payer: Self-pay | Admitting: Anesthesiology

## 2012-12-08 ENCOUNTER — Inpatient Hospital Stay (HOSPITAL_COMMUNITY): Payer: Medicare Other | Admitting: Anesthesiology

## 2012-12-08 ENCOUNTER — Encounter (HOSPITAL_COMMUNITY)
Admission: RE | Disposition: A | Discharge status: Home Health | Payer: Self-pay | Source: Ambulatory Visit | Attending: Neurological Surgery

## 2012-12-08 ENCOUNTER — Inpatient Hospital Stay (HOSPITAL_COMMUNITY)
Admission: RE | Admit: 2012-12-08 | Discharge: 2012-12-11 | DRG: 460 | Disposition: A | Discharge status: Home Health | Payer: Medicare Other | Source: Ambulatory Visit | Attending: Neurological Surgery | Admitting: Neurological Surgery

## 2012-12-08 DIAGNOSIS — E119 Type 2 diabetes mellitus without complications: Secondary | ICD-10-CM | POA: Diagnosis present

## 2012-12-08 DIAGNOSIS — Z7409 Other reduced mobility: Secondary | ICD-10-CM

## 2012-12-08 DIAGNOSIS — K219 Gastro-esophageal reflux disease without esophagitis: Secondary | ICD-10-CM | POA: Diagnosis present

## 2012-12-08 DIAGNOSIS — G35 Multiple sclerosis: Secondary | ICD-10-CM | POA: Diagnosis present

## 2012-12-08 DIAGNOSIS — F17200 Nicotine dependence, unspecified, uncomplicated: Secondary | ICD-10-CM | POA: Diagnosis present

## 2012-12-08 DIAGNOSIS — I2510 Atherosclerotic heart disease of native coronary artery without angina pectoris: Secondary | ICD-10-CM | POA: Diagnosis present

## 2012-12-08 DIAGNOSIS — M5137 Other intervertebral disc degeneration, lumbosacral region: Principal | ICD-10-CM | POA: Diagnosis present

## 2012-12-08 DIAGNOSIS — I252 Old myocardial infarction: Secondary | ICD-10-CM

## 2012-12-08 DIAGNOSIS — Q762 Congenital spondylolisthesis: Secondary | ICD-10-CM

## 2012-12-08 DIAGNOSIS — E782 Mixed hyperlipidemia: Secondary | ICD-10-CM | POA: Diagnosis present

## 2012-12-08 DIAGNOSIS — M51379 Other intervertebral disc degeneration, lumbosacral region without mention of lumbar back pain or lower extremity pain: Principal | ICD-10-CM | POA: Diagnosis present

## 2012-12-08 DIAGNOSIS — Z951 Presence of aortocoronary bypass graft: Secondary | ICD-10-CM

## 2012-12-08 HISTORY — DX: Age-related osteoporosis without current pathological fracture: M81.0

## 2012-12-08 SURGERY — POSTERIOR LUMBAR FUSION 1 LEVEL
Anesthesia: General | Site: Back | Wound class: Clean

## 2012-12-08 MED ORDER — POTASSIUM CHLORIDE IN NACL 20-0.9 MEQ/L-% IV SOLN
INTRAVENOUS | Status: DC
  Administered 2012-12-08 – 2012-12-09 (×2): via INTRAVENOUS
  Filled 2012-12-08 (×6): qty 1000

## 2012-12-08 MED ORDER — ACETAMINOPHEN 325 MG PO TABS: 650.0000 mg | ORAL_TABLET | ORAL | Status: DC | PRN

## 2012-12-08 MED ORDER — ONDANSETRON HCL 4 MG/2ML IJ SOLN
INTRAMUSCULAR | Status: DC | PRN
  Administered 2012-12-08: 4 mg via INTRAVENOUS

## 2012-12-08 MED ORDER — SODIUM CHLORIDE 0.9 % IV SOLN: 250.0000 mL | INTRAVENOUS | Status: DC

## 2012-12-08 MED ORDER — TRAZODONE HCL 150 MG PO TABS
150.0000 mg | ORAL_TABLET | Freq: Every day | ORAL | Status: DC
  Administered 2012-12-08 – 2012-12-10 (×3): 150 mg via ORAL
  Filled 2012-12-08 (×4): qty 1

## 2012-12-08 MED ORDER — BUPIVACAINE HCL (PF) 0.25 % IJ SOLN
INTRAMUSCULAR | Status: DC | PRN
  Administered 2012-12-08: 30 mL

## 2012-12-08 MED ORDER — ASPIRIN EC 325 MG PO TBEC
325.0000 mg | DELAYED_RELEASE_TABLET | Freq: Every day | ORAL | Status: DC
Start: 2012-12-09 — End: 2012-12-11
  Administered 2012-12-10 – 2012-12-11 (×2): 325 mg via ORAL
  Filled 2012-12-08 (×4): qty 1

## 2012-12-08 MED ORDER — SUCCINYLCHOLINE CHLORIDE 20 MG/ML IJ SOLN
INTRAMUSCULAR | Status: DC | PRN
  Administered 2012-12-08: 100 mg via INTRAVENOUS

## 2012-12-08 MED ORDER — ARTIFICIAL TEARS OP OINT
TOPICAL_OINTMENT | OPHTHALMIC | Status: DC | PRN
  Administered 2012-12-08: 1 via OPHTHALMIC

## 2012-12-08 MED ORDER — 0.9 % SODIUM CHLORIDE (POUR BTL) OPTIME
TOPICAL | Status: DC | PRN
  Administered 2012-12-08: 1000 mL

## 2012-12-08 MED ORDER — PANTOPRAZOLE SODIUM 40 MG PO TBEC
40.0000 mg | DELAYED_RELEASE_TABLET | Freq: Two times a day (BID) | ORAL | Status: DC
  Administered 2012-12-09 – 2012-12-11 (×5): 40 mg via ORAL
  Filled 2012-12-08 (×5): qty 1

## 2012-12-08 MED ORDER — FENTANYL CITRATE 0.05 MG/ML IJ SOLN
INTRAMUSCULAR | Status: DC | PRN
  Administered 2012-12-08 (×6): 50 ug via INTRAVENOUS
  Administered 2012-12-08: 100 ug via INTRAVENOUS

## 2012-12-08 MED ORDER — MENTHOL 3 MG MT LOZG: 1.0000 | LOZENGE | OROMUCOSAL | Status: DC | PRN

## 2012-12-08 MED ORDER — SODIUM CHLORIDE 0.9 % IJ SOLN: 3.0000 mL | INTRAMUSCULAR | Status: DC | PRN

## 2012-12-08 MED ORDER — DULOXETINE HCL 60 MG PO CPEP
60.0000 mg | ORAL_CAPSULE | Freq: Every day | ORAL | Status: DC
  Administered 2012-12-08 – 2012-12-10 (×3): 60 mg via ORAL
  Filled 2012-12-08 (×4): qty 1

## 2012-12-08 MED ORDER — MEPERIDINE HCL 25 MG/ML IJ SOLN: 6.2500 mg | INTRAMUSCULAR | Status: DC | PRN

## 2012-12-08 MED ORDER — HYDROMORPHONE HCL PF 1 MG/ML IJ SOLN
0.5000 mg | INTRAMUSCULAR | Status: DC | PRN
  Administered 2012-12-08 – 2012-12-11 (×3): 1 mg via INTRAVENOUS
  Filled 2012-12-08 (×3): qty 1

## 2012-12-08 MED ORDER — SODIUM CHLORIDE 0.9 % IJ SOLN
3.0000 mL | Freq: Two times a day (BID) | INTRAMUSCULAR | Status: DC
  Administered 2012-12-10 – 2012-12-11 (×3): 3 mL via INTRAVENOUS

## 2012-12-08 MED ORDER — SODIUM CHLORIDE 0.9 % IV SOLN
INTRAVENOUS | Status: AC
  Filled 2012-12-08: qty 500

## 2012-12-08 MED ORDER — DEXTROSE 5 % IV SOLN
500.0000 mg | Freq: Four times a day (QID) | INTRAVENOUS | Status: DC | PRN
  Filled 2012-12-08: qty 5

## 2012-12-08 MED ORDER — PROPOFOL 10 MG/ML IV BOLUS
INTRAVENOUS | Status: DC | PRN
  Administered 2012-12-08: 90 mg via INTRAVENOUS

## 2012-12-08 MED ORDER — MIDAZOLAM HCL 2 MG/2ML IJ SOLN: 0.5000 mg | Freq: Once | INTRAMUSCULAR | Status: DC | PRN

## 2012-12-08 MED ORDER — OXYCODONE HCL 5 MG PO TABS
10.0000 mg | ORAL_TABLET | ORAL | Status: DC | PRN
  Administered 2012-12-09 – 2012-12-11 (×7): 10 mg via ORAL
  Filled 2012-12-08 (×7): qty 2

## 2012-12-08 MED ORDER — THROMBIN 20000 UNITS EX SOLR
CUTANEOUS | Status: DC | PRN
  Administered 2012-12-08: 14:00:00 via TOPICAL

## 2012-12-08 MED ORDER — NITROGLYCERIN 0.4 MG SL SUBL: 0.4000 mg | SUBLINGUAL_TABLET | SUBLINGUAL | Status: DC | PRN

## 2012-12-08 MED ORDER — PROMETHAZINE HCL 25 MG/ML IJ SOLN
INTRAMUSCULAR | Status: AC
  Filled 2012-12-08: qty 1

## 2012-12-08 MED ORDER — SENNA 8.6 MG PO TABS
1.0000 | ORAL_TABLET | Freq: Two times a day (BID) | ORAL | Status: DC
  Administered 2012-12-08 – 2012-12-11 (×6): 8.6 mg via ORAL
  Filled 2012-12-08 (×8): qty 1

## 2012-12-08 MED ORDER — ACETAMINOPHEN 650 MG RE SUPP: 650.0000 mg | RECTAL | Status: DC | PRN

## 2012-12-08 MED ORDER — DEXAMETHASONE SODIUM PHOSPHATE 10 MG/ML IJ SOLN
10.0000 mg | INTRAMUSCULAR | Status: DC
  Filled 2012-12-08: qty 1

## 2012-12-08 MED ORDER — BACITRACIN 50000 UNITS IM SOLR
INTRAMUSCULAR | Status: AC
  Filled 2012-12-08: qty 1

## 2012-12-08 MED ORDER — SODIUM CHLORIDE 0.9 % IR SOLN
Status: DC | PRN
  Administered 2012-12-08: 14:00:00

## 2012-12-08 MED ORDER — OXYCODONE HCL 5 MG/5ML PO SOLN: 5.0000 mg | Freq: Once | ORAL | Status: DC | PRN

## 2012-12-08 MED ORDER — HYDROMORPHONE HCL PF 1 MG/ML IJ SOLN
0.2500 mg | INTRAMUSCULAR | Status: DC | PRN
  Administered 2012-12-08 (×3): 0.5 mg via INTRAVENOUS

## 2012-12-08 MED ORDER — LIDOCAINE HCL (CARDIAC) 20 MG/ML IV SOLN
INTRAVENOUS | Status: DC | PRN
  Administered 2012-12-08: 20 mg via INTRAVENOUS

## 2012-12-08 MED ORDER — OXYCODONE HCL 5 MG PO TABS
ORAL_TABLET | ORAL | Status: AC
  Filled 2012-12-08: qty 1

## 2012-12-08 MED ORDER — ACETAMINOPHEN 10 MG/ML IV SOLN: 1000.0000 mg | Freq: Once | INTRAVENOUS | Status: DC

## 2012-12-08 MED ORDER — DEXAMETHASONE 4 MG PO TABS
4.0000 mg | ORAL_TABLET | Freq: Four times a day (QID) | ORAL | Status: DC
  Administered 2012-12-08 – 2012-12-11 (×12): 4 mg via ORAL
  Filled 2012-12-08 (×15): qty 1

## 2012-12-08 MED ORDER — GABAPENTIN 600 MG PO TABS
600.0000 mg | ORAL_TABLET | Freq: Every day | ORAL | Status: DC
  Administered 2012-12-08 – 2012-12-10 (×3): 600 mg via ORAL
  Filled 2012-12-08 (×4): qty 1

## 2012-12-08 MED ORDER — MIDAZOLAM HCL 5 MG/5ML IJ SOLN
INTRAMUSCULAR | Status: DC | PRN
  Administered 2012-12-08: 2 mg via INTRAVENOUS

## 2012-12-08 MED ORDER — DEXAMETHASONE SODIUM PHOSPHATE 4 MG/ML IJ SOLN
4.0000 mg | Freq: Four times a day (QID) | INTRAMUSCULAR | Status: DC
  Filled 2012-12-08 (×12): qty 1

## 2012-12-08 MED ORDER — ACETAMINOPHEN 10 MG/ML IV SOLN
1000.0000 mg | Freq: Four times a day (QID) | INTRAVENOUS | Status: AC
  Administered 2012-12-08 – 2012-12-09 (×4): 1000 mg via INTRAVENOUS
  Filled 2012-12-08 (×4): qty 100

## 2012-12-08 MED ORDER — LACTATED RINGERS IV SOLN
INTRAVENOUS | Status: DC | PRN
  Administered 2012-12-08 (×2): via INTRAVENOUS

## 2012-12-08 MED ORDER — ACETAMINOPHEN 10 MG/ML IV SOLN
INTRAVENOUS | Status: AC
  Administered 2012-12-08: 1000 mg via INTRAVENOUS
  Filled 2012-12-08: qty 100

## 2012-12-08 MED ORDER — DEXAMETHASONE SODIUM PHOSPHATE 10 MG/ML IJ SOLN
INTRAMUSCULAR | Status: AC
  Administered 2012-12-08: 10 mg via INTRAVENOUS
  Filled 2012-12-08: qty 1

## 2012-12-08 MED ORDER — HYDROMORPHONE HCL PF 1 MG/ML IJ SOLN
INTRAMUSCULAR | Status: AC
  Filled 2012-12-08: qty 2

## 2012-12-08 MED ORDER — PHENYLEPHRINE HCL 10 MG/ML IJ SOLN
INTRAMUSCULAR | Status: DC | PRN
  Administered 2012-12-08 (×2): 80 ug via INTRAVENOUS

## 2012-12-08 MED ORDER — CEFAZOLIN SODIUM 1-5 GM-% IV SOLN
1.0000 g | Freq: Three times a day (TID) | INTRAVENOUS | Status: AC
  Administered 2012-12-08 – 2012-12-09 (×2): 1 g via INTRAVENOUS
  Filled 2012-12-08 (×2): qty 50

## 2012-12-08 MED ORDER — PROMETHAZINE HCL 25 MG/ML IJ SOLN
6.2500 mg | INTRAMUSCULAR | Status: DC | PRN
  Administered 2012-12-08: 12.5 mg via INTRAVENOUS

## 2012-12-08 MED ORDER — OXYCODONE HCL 5 MG PO TABS: 5.0000 mg | ORAL_TABLET | Freq: Once | ORAL | Status: DC | PRN

## 2012-12-08 MED ORDER — LIDOCAINE HCL 4 % MT SOLN
OROMUCOSAL | Status: DC | PRN
  Administered 2012-12-08: 4 mL via TOPICAL

## 2012-12-08 MED ORDER — METOPROLOL TARTRATE 25 MG PO TABS
25.0000 mg | ORAL_TABLET | Freq: Two times a day (BID) | ORAL | Status: DC
  Administered 2012-12-08 – 2012-12-11 (×5): 25 mg via ORAL
  Filled 2012-12-08 (×7): qty 1

## 2012-12-08 MED ORDER — ONDANSETRON HCL 4 MG/2ML IJ SOLN: 4.0000 mg | INTRAMUSCULAR | Status: DC | PRN

## 2012-12-08 MED ORDER — METHOCARBAMOL 500 MG PO TABS
500.0000 mg | ORAL_TABLET | Freq: Four times a day (QID) | ORAL | Status: DC | PRN
  Administered 2012-12-09 – 2012-12-11 (×5): 500 mg via ORAL
  Filled 2012-12-08 (×5): qty 1

## 2012-12-08 MED ORDER — PHENYLEPHRINE HCL 10 MG/ML IJ SOLN
10.0000 mg | INTRAVENOUS | Status: DC | PRN
  Administered 2012-12-08: 25 ug/min via INTRAVENOUS

## 2012-12-08 MED ORDER — PHENOL 1.4 % MT LIQD: 1.0000 | OROMUCOSAL | Status: DC | PRN

## 2012-12-08 SURGICAL SUPPLY — 66 items
BAG DECANTER FOR FLEXI CONT (MISCELLANEOUS) ×2 IMPLANT
BENZOIN TINCTURE PRP APPL 2/3 (GAUZE/BANDAGES/DRESSINGS) ×2 IMPLANT
BLADE SURG ROTATE 9660 (MISCELLANEOUS) IMPLANT
BONE MATRIX OSTEOCEL PRO MED (Bone Implant) ×4 IMPLANT
BUR MATCHSTICK NEURO 3.0 LAGG (BURR) ×2 IMPLANT
CAGE COROENT MP 8X23 (Cage) ×4 IMPLANT
CANISTER SUCTION 2500CC (MISCELLANEOUS) ×2 IMPLANT
CLIP NEUROVISION LG (CLIP) ×2 IMPLANT
CLOTH BEACON ORANGE TIMEOUT ST (SAFETY) ×2 IMPLANT
CONT SPEC 4OZ CLIKSEAL STRL BL (MISCELLANEOUS) ×4 IMPLANT
COVER BACK TABLE 24X17X13 BIG (DRAPES) IMPLANT
COVER TABLE BACK 60X90 (DRAPES) ×2 IMPLANT
DRAPE C-ARM 42X72 X-RAY (DRAPES) ×4 IMPLANT
DRAPE LAPAROTOMY 100X72X124 (DRAPES) ×2 IMPLANT
DRAPE POUCH INSTRU U-SHP 10X18 (DRAPES) ×2 IMPLANT
DRAPE SURG 17X23 STRL (DRAPES) ×2 IMPLANT
DRESSING TELFA 8X3 (GAUZE/BANDAGES/DRESSINGS) ×2 IMPLANT
DRSG OPSITE 4X5.5 SM (GAUZE/BANDAGES/DRESSINGS) ×4 IMPLANT
DRSG OPSITE POSTOP 4X6 (GAUZE/BANDAGES/DRESSINGS) ×2 IMPLANT
DURAPREP 26ML APPLICATOR (WOUND CARE) ×2 IMPLANT
ELECT REM PT RETURN 9FT ADLT (ELECTROSURGICAL) ×2
ELECTRODE REM PT RTRN 9FT ADLT (ELECTROSURGICAL) ×1 IMPLANT
EVACUATOR 1/8 PVC DRAIN (DRAIN) ×2 IMPLANT
GAUZE SPONGE 4X4 16PLY XRAY LF (GAUZE/BANDAGES/DRESSINGS) IMPLANT
GLOVE BIOGEL M 8.0 STRL (GLOVE) ×4 IMPLANT
GLOVE BIOGEL PI IND STRL 8 (GLOVE) ×1 IMPLANT
GLOVE BIOGEL PI INDICATOR 8 (GLOVE) ×1
GLOVE ECLIPSE 7.5 STRL STRAW (GLOVE) ×6 IMPLANT
GLOVE INDICATOR 7.0 STRL GRN (GLOVE) ×6 IMPLANT
GLOVE SURG SS PI 6.5 STRL IVOR (GLOVE) ×4 IMPLANT
GLOVE SURG SS PI 7.0 STRL IVOR (GLOVE) ×4 IMPLANT
GOWN BRE IMP SLV AUR LG STRL (GOWN DISPOSABLE) IMPLANT
GOWN BRE IMP SLV AUR XL STRL (GOWN DISPOSABLE) ×4 IMPLANT
GOWN STRL REIN 2XL LVL4 (GOWN DISPOSABLE) ×6 IMPLANT
HEMOSTAT POWDER KIT SURGIFOAM (HEMOSTASIS) IMPLANT
KIT BASIN OR (CUSTOM PROCEDURE TRAY) ×2 IMPLANT
KIT NEEDLE NVM5 EMG ELECT (KITS) ×1 IMPLANT
KIT NEEDLE NVM5 EMG ELECTRODE (KITS) ×1
KIT ROOM TURNOVER OR (KITS) ×2 IMPLANT
MILL MEDIUM DISP (BLADE) ×2 IMPLANT
NEEDLE HYPO 25X1 1.5 SAFETY (NEEDLE) ×2 IMPLANT
NS IRRIG 1000ML POUR BTL (IV SOLUTION) ×2 IMPLANT
PACK LAMINECTOMY NEURO (CUSTOM PROCEDURE TRAY) ×2 IMPLANT
PAD ARMBOARD 7.5X6 YLW CONV (MISCELLANEOUS) ×6 IMPLANT
ROD 50MM LUMBAR (Rod) ×4 IMPLANT
SCREW LOCK (Screw) ×6 IMPLANT
SCREW LOCK FXNS SPNE MAS PL (Screw) ×6 IMPLANT
SCREW PLIF MAS 5.0X25MM (Screw) ×4 IMPLANT
SCREW SHANK 5.5X30MM (Screw) ×4 IMPLANT
SCREW SHANK 6.5X65 (Screw) ×2 IMPLANT
SCREW SHANKS 5.5X35 (Screw) ×4 IMPLANT
SCREW TULIP 5.5 (Screw) ×10 IMPLANT
SPONGE GAUZE 4X4 12PLY (GAUZE/BANDAGES/DRESSINGS) ×2 IMPLANT
SPONGE LAP 4X18 X RAY DECT (DISPOSABLE) IMPLANT
SPONGE SURGIFOAM ABS GEL 100 (HEMOSTASIS) ×2 IMPLANT
STRIP CLOSURE SKIN 1/2X4 (GAUZE/BANDAGES/DRESSINGS) ×2 IMPLANT
SUT VIC AB 0 CT1 18XCR BRD8 (SUTURE) ×1 IMPLANT
SUT VIC AB 0 CT1 8-18 (SUTURE) ×1
SUT VIC AB 2-0 CP2 18 (SUTURE) ×2 IMPLANT
SUT VIC AB 3-0 SH 8-18 (SUTURE) ×4 IMPLANT
SYR 20ML ECCENTRIC (SYRINGE) ×2 IMPLANT
TOWEL OR 17X24 6PK STRL BLUE (TOWEL DISPOSABLE) ×2 IMPLANT
TOWEL OR 17X26 10 PK STRL BLUE (TOWEL DISPOSABLE) ×2 IMPLANT
TRAP SPECIMEN MUCOUS 40CC (MISCELLANEOUS) IMPLANT
TRAY FOLEY CATH 14FRSI W/METER (CATHETERS) ×2 IMPLANT
WATER STERILE IRR 1000ML POUR (IV SOLUTION) ×2 IMPLANT

## 2012-12-08 NOTE — Anesthesia Preprocedure Evaluation (Addendum)
Anesthesia Evaluation  Patient identified by MRN, date of birth, ID band Patient awake    Reviewed: Allergy & Precautions, H&P , NPO status , Patient's Chart, lab work & pertinent test results  History of Anesthesia Complications Negative for: history of anesthetic complications  Airway Mallampati: I TM Distance: >3 FB Neck ROM: Full    Dental  (+) Edentulous Upper and Edentulous Lower   Pulmonary COPD COPD inhaler, Current Smoker,  breath sounds clear to auscultation  Pulmonary exam normal       Cardiovascular + CAD, + Past MI, + Cardiac Stents (ASA), + CABG ('12, no chest pain since) and + Peripheral Vascular Disease Rhythm:Regular Rate:Normal  Left subclavian steal syndrome '12 ECHO: normal LVF, EF 55%, mild MR   Neuro/Psych Chronic back pain: narcotics    GI/Hepatic Neg liver ROS, GERD-  Medicated and Controlled,  Endo/Other  diabetes, Type 2  Renal/GU negative Renal ROS  negative genitourinary   Musculoskeletal  (+) Arthritis -, Osteoarthritis,    Abdominal   Peds  Hematology negative hematology ROS (+)   Anesthesia Other Findings   Reproductive/Obstetrics negative OB ROS                        Anesthesia Physical Anesthesia Plan  ASA: III  Anesthesia Plan: General   Post-op Pain Management:    Induction: Intravenous  Airway Management Planned: Oral ETT  Additional Equipment:   Intra-op Plan:   Post-operative Plan: Extubation in OR  Informed Consent: I have reviewed the patients History and Physical, chart, labs and discussed the procedure including the risks, benefits and alternatives for the proposed anesthesia with the patient or authorized representative who has indicated his/her understanding and acceptance.     Plan Discussed with: Surgeon and CRNA  Anesthesia Plan Comments: (Plan routine monitors, GETA)        Anesthesia Quick Evaluation

## 2012-12-08 NOTE — Anesthesia Postprocedure Evaluation (Signed)
  Anesthesia Post-op Note  Patient: [name]  Procedure(s) Performed: Procedure(s) with comments: Lumbar Four to sacral one posterior lumbar interbody fusion (N/A) - POSTERIOR LUMBAR FUSION 2 LEVEL  Patient Location: PACU  Anesthesia Type:General  Level of Consciousness: awake and alert   Airway and Oxygen Therapy: Patient Spontanous Breathing  Post-op Pain: mild  Post-op Assessment: Post-op Vital signs reviewed, Patient's Cardiovascular Status Stable, Respiratory Function Stable and Patent Airway  Post-op Vital Signs: Reviewed and stable  Complications: No apparent anesthesia complications

## 2012-12-08 NOTE — Preoperative (Signed)
Beta Blockers   Reason not to administer Beta Blockers:Not Applicable 

## 2012-12-08 NOTE — Op Note (Signed)
12/08/2012  5:37 PM  PATIENT:  [name]  62 y.o. female  PRE-OPERATIVE DIAGNOSIS:  Grade 2 lytic spondylolisthesis L5-S1, severe foraminal stenosis, back and leg pain  POST-OPERATIVE DIAGNOSIS:  same  PROCEDURE:   1. Gill-type Decompressive lumbar laminectomy L5-S1 requiring more work than would be required of the typical PLIF procedure in order to adequately decompress the neural elements.  2. Posterior lumbar interbody fusion L5-S1 using  PEEK interbody cages packed with morcellized allograft and autograft   3. Posterior fixation L4-S1 using cortical pedicle screws.  4. Intertransverse arthrodesis L4-5 using morcellized autograft and allograft.  SURGEON:  Marikay Alar, MD  ASSISTANTS: Dr. Phoebe Perch  ANESTHESIA:  General  EBL: 300 ml  Total I/O In: 1600 [I.V.:1500; Blood:100] Out: 635 [Urine:335; Blood:300]  BLOOD ADMINISTERED:none  DRAINS: Hemovac   INDICATION FOR PROCEDURE: This patient presented with a long history of back and bilateral leg pain left greater than right. She tried medical management quite some time without relief. R. I and plain films showed segmental instability with a grade 2 spondylolisthesis at L5-S1 with pars defects and severe foraminal stenosis. Recommended a decompression and instrumented fusion at L5-S1. Patient understood the risks, benefits, and alternatives and potential outcomes and wished to proceed.  Findings at operation: Because of her grade 2 spondylolisthesis and her hyperlordotic spine it was very difficult to get adequate purchase with the L5 pedicle screws and still connected in with a lordotic rod. Therefore I carried the instrumentation and fusion up to L4 as is not uncommon and situation such as this.  PROCEDURE DETAILS:  The patient was brought to the operating room. After induction of generalized endotracheal anesthesia the patient was rolled into the prone position on chest rolls and all pressure points were padded. The patient's  lumbar region was cleaned and then prepped with DuraPrep and draped in the usual sterile fashion. Anesthesia was injected and then a dorsal midline incision was made and carried down to the lumbosacral fascia. The fascia was opened and the paraspinous musculature was taken down in a subperiosteal fashion to expose L5-S1. Intraoperative fluoroscopy confirmed my level, and the spinous process was removed and complete lumbar laminectomies, hemi- facetectomies, and foraminotomies were performed at L5-S1. This was done in a Gill-type procedure as she had spondylolisthesis and spondylolysis of L5. The yellow ligament was removed to expose the underlying dura and nerve roots, and generous foraminotomies were performed to adequately decompress the neural elements. There was severe compression of the L5 nerve roots within the foramen. Once the decompression was complete, I turned my attention to the posterior lower lumbar interbody fusion. The epidural venous vasculature was coagulated and cut sharply. Disc space was incised and the initial discectomy was performed with pituitary rongeurs. The disc space was distracted with sequential distractors to a height of 8 mm. We then used a series of scrapers and shavers to prepare the endplates for fusion. The midline was prepared with Epstein curettes. Once the complete discectomy was finished, we packed an appropriate sized peek interbody cage with local autograft and morcellized allograft, gently retracted the nerve root, and tapped the cage into position at L5-S1 bilaterally.  The midline was packed with morselized autograft and allograft. We then turned our attention to the posterior fixation. The pedicle screw entry zones were identified utilizing surface landmarks and AP and lateral fluoroscopy. We started by placement of the L5 pedicle screws. We drilled in an upward and outward direction to place her cortical pedicle screws. We tap line to  line. I then placed a 50 by 35 mm  pedicle screw at L5 on the right, but the only way to get it to match up with the S1 screw was to leave it very proud. I did not think this was very biomechanically sound, and felt that this would fail. Therefore I exposed the pars of L4 and decided to carry the instrumentation up to L4 to get segmental fixation to reduce the chance of failure of the construct. This is not uncommon in cases such as this with a spondylolisthesis and a grade 2 spondylolisthesis at L5-S1 with a hyperlordotic segment. I localized the pedicle screw entry zones of L4 and then again drilled in an upward and outward direction, tapped Line to line, and placed 5 5 x 30 mm pedicle screws at L4 bilaterally. I placed cortical pedicle screws at L5 bilaterally, and then turned my attention to placing the sacral pedicle screws. We probed each pedicle with the pedicle probe and tapped each pedicle with the appropriate tap. We palpated with a ball probe to assure no break in the cortex. We then placed 6 5 x 35 mm pedicle screws into the pedicles bilaterally at S1. We then decorticated the transverse processes and laid a mixture of morcellized autograft and allograft out over these to perform intertransverse arthrodesis at L4-5. We then placed lordotic rods into the multiaxial screw heads of the pedicle screws and locked these in position with the locking caps and anti-torque device.  We then checked our construct with AP and lateral fluoroscopy. Irrigated with copious amounts of bacitracin-containing saline solution. Placed a medium Hemovac drain through separate stab incision. Inspected the nerve roots once again to assure adequate decompression, lined to the dura with Gelfoam, and closed the muscle and the fascia with 0 Vicryl. Closed the subcutaneous tissues with 2-0 Vicryl and subcuticular tissues with 3-0 Vicryl. The skin was closed with benzoin and Steri-Strips. Dressing was then applied, the patient was awakened from general anesthesia and  transported to the recovery room in stable condition. At the end of the procedure all sponge, needle and instrument counts were correct.   PLAN OF CARE: Admit to inpatient   PATIENT DISPOSITION:  PACU - hemodynamically stable.   Delay start of Pharmacological VTE agent (>24hrs) due to surgical blood loss or risk of bleeding:  yes

## 2012-12-08 NOTE — H&P (Signed)
Subjective: Patient is a 62 y.o. female admitted for PLIF L5-S1. Onset of symptoms was several years ago, gradually worsening since that time.  The pain is rated severe, and is located at the across the lower back and radiates to legs. The pain is described as aching and stabbing and occurs all day. The symptoms have been progressive. Symptoms are exacerbated by exercise and standing. MRI or CT showed lytic spondylolisthesis with stenosis L5-S1 involving both the L5 and S1 nerve roots.   Past Medical History  Diagnosis Date  . Mixed hyperlipidemia   . MI (myocardial infarction)   . MS (multiple sclerosis)   . Type 2 diabetes mellitus   . Chronic hoarseness   . GERD (gastroesophageal reflux disease)   . Subclavian vein stenosis, left     Ststus post stenting - Dr. Excell Seltzer  . Coronary atherosclerosis of native coronary artery     BMS circumflex, DES LAD, CABG 2012    Past Surgical History  Procedure Laterality Date  . Coronary artery bypass graft  February 2012    LIMA to LAD, SVG to OM, SVG to RCA and PDA  . Cholecystectomy    . Coronary angioplasty    . Tonsillectomy    . Wrist surgery      Prior to Admission medications   Medication Sig Start Date End Date Taking? Authorizing Provider  aspirin EC 325 MG tablet Take 325 mg by mouth daily.   Yes Historical Provider, MD  B Complex Vitamins (VITAMIN B COMPLEX PO) Take 1 tablet by mouth daily.    Yes Historical Provider, MD  Calcium Citrate (CITRACAL PO) Take 1 Units by mouth daily. Chocolate chewable   Yes Historical Provider, MD  Cyanocobalamin (VITAMIN B 12 PO) Take 1 tablet by mouth daily.   Yes Historical Provider, MD  DULoxetine (CYMBALTA) 60 MG capsule Take 60 mg by mouth at bedtime.   Yes Historical Provider, MD  gabapentin (NEURONTIN) 600 MG tablet Take 600 mg by mouth at bedtime.    Yes Historical Provider, MD  metoprolol tartrate (LOPRESSOR) 25 MG tablet Take 25 mg by mouth 2 (two) times daily.   Yes Historical Provider, MD   nitroGLYCERIN (NITROSTAT) 0.4 MG SL tablet Place 0.4 mg under the tongue every 5 (five) minutes as needed for chest pain.    Yes Historical Provider, MD  oxyCODONE (OXY IR/ROXICODONE) 5 MG immediate release tablet Take 5 mg by mouth every 6 (six) hours as needed for pain.   Yes Historical Provider, MD  pantoprazole (PROTONIX) 40 MG tablet Take 40 mg by mouth 2 (two) times daily.   Yes Historical Provider, MD  simvastatin (ZOCOR) 20 MG tablet Take 20 mg by mouth every evening.   Yes Historical Provider, MD  traZODone (DESYREL) 150 MG tablet Take 150 mg by mouth at bedtime.   Yes Historical Provider, MD  VITAMIN D, CHOLECALCIFEROL, PO Take 1 tablet by mouth daily.   Yes Historical Provider, MD  vitamin E 100 UNIT capsule Take 100 Units by mouth daily.   Yes Historical Provider, MD   Allergies  Allergen Reactions  . Morphine Sulfate     REACTION: ALTERED MENTAL STATUS  . Sulfa Antibiotics Nausea And Vomiting  . Zolpidem Tartrate     Nightmares, makes her angry    History  Substance Use Topics  . Smoking status: Current Every Day Smoker    Types: Cigarettes  . Smokeless tobacco: Not on file     Comment: 1/2 pack per day  . Alcohol  Use: Yes    Family History  Problem Relation Age of Onset  . Cancer Father   . Cancer Mother      Review of Systems  Positive ROS: neg  All other systems have been reviewed and were otherwise negative with the exception of those mentioned in the HPI and as above.  Objective: Vital signs in last 24 hours:    General Appearance: Alert, cooperative, no distress, appears stated age Head: Normocephalic, without obvious abnormality, atraumatic Eyes: PERRL, conjunctiva/corneas clear, EOM's intact, fundi benign, both eyes      Ears: Normal TM's and external ear canals, both ears Throat: Lips, mucosa, and tongue normal; teeth and gums normal Neck: Supple, symmetrical, trachea midline, no adenopathy; thyroid: No enlargement/tenderness/nodules; no carotid  bruit or JVD Back: Symmetric, no curvature, ROM normal, no CVA tenderness Lungs: Clear to auscultation bilaterally, respirations unlabored Heart: Regular rate and rhythm, S1 and S2 normal, no murmur, rub or gallop Abdomen: Soft, non-tender, bowel sounds active all four quadrants, no masses, no organomegaly Extremities: Extremities normal, atraumatic, no cyanosis or edema Pulses: 2+ and symmetric all extremities Skin: Skin color, texture, turgor normal, no rashes or lesions  NEUROLOGIC:   Mental status: Alert and oriented x4,  no aphasia, good attention span, fund of knowledge, and memory Motor Exam - grossly normal Sensory Exam - grossly normal Reflexes: 1+ Coordination - grossly normal Gait - grossly normal Balance - grossly normal Cranial Nerves: I: smell Not tested  II: visual acuity  OS: nl    OD: nl  II: visual fields Full to confrontation  II: pupils Equal, round, reactive to light  III,VII: ptosis None  III,IV,VI: extraocular muscles  Full ROM  V: mastication Normal  V: facial light touch sensation  Normal  V,VII: corneal reflex  Present  VII: facial muscle function - upper  Normal  VII: facial muscle function - lower Normal  VIII: hearing Not tested  IX: soft palate elevation  Normal  IX,X: gag reflex Present  XI: trapezius strength  5/5  XI: sternocleidomastoid strength 5/5  XI: neck flexion strength  5/5  XII: tongue strength  Normal    Data Review Lab Results  Component Value Date   WBC 8.1 11/30/2012   HGB 12.9 11/30/2012   HCT 37.2 11/30/2012   MCV 81.6 11/30/2012   PLT 232 11/30/2012   Lab Results  Component Value Date   NA 134* 11/30/2012   K 3.7 11/30/2012   CL 96 11/30/2012   CO2 28 11/30/2012   BUN 7 11/30/2012   CREATININE 0.68 11/30/2012   GLUCOSE 79 11/30/2012   Lab Results  Component Value Date   INR 0.89 11/30/2012    Assessment/Plan: Patient admitted for PLIF L5-S1. Patient has failed conservative therapy.  I explained the condition and  procedure to the patient and answered any questions.  Patient wishes to proceed with procedure as planned. Understands risks/ benefits and typical outcomes of procedure.   Orlene Salmons S 12/08/2012 10:31 AM

## 2012-12-08 NOTE — Transfer of Care (Signed)
Immediate Anesthesia Transfer of Care Note  Patient: [name]  Procedure(s) Performed: Procedure(s) with comments: Lumbar Four to sacral one posterior lumbar interbody fusion (N/A) - POSTERIOR LUMBAR FUSION 2 LEVEL  Patient Location: PACU  Anesthesia Type:General  Level of Consciousness: awake, alert  and oriented  Airway & Oxygen Therapy: Patient connected to face mask  Post-op Assessment: Report given to PACU RN  Post vital signs: stable  Complications: No apparent anesthesia complications

## 2012-12-08 NOTE — Progress Notes (Signed)
Care of pt assumed by MA Kristopher Attwood RN 

## 2012-12-08 NOTE — Progress Notes (Signed)
Noted and marked bleeding on dressing on back

## 2012-12-09 MED ORDER — ENSURE COMPLETE PO LIQD
237.0000 mL | Freq: Two times a day (BID) | ORAL | Status: DC
  Administered 2012-12-09 – 2012-12-11 (×5): 237 mL via ORAL

## 2012-12-09 NOTE — Evaluation (Signed)
I have read and agree with the below evaluation note and plan. Ivonne Andrew PT, DPT Pager: [phone]

## 2012-12-09 NOTE — Evaluation (Signed)
Occupational Therapy Evaluation Patient Details Name: [name] MRN: [id number] DOB: Aug 10, 1951 Today's Date: 12/09/2012 Time: 1350-1430 OT Time Calculation (min): 40 min  OT Assessment / Plan / Recommendation Clinical Impression  Pt s/p PLIF L5-S1.  Pt is overall min guard - min assist with ADLs/bed mobility.  Education completed with pt and family. No DME needs. Will sign off at this time.    OT Assessment  Patient does not need any further OT services    Follow Up Recommendations  No OT follow up;Supervision/Assistance - 24 hour    Barriers to Discharge      Equipment Recommendations  None recommended by OT    Recommendations for Other Services    Frequency       Precautions / Restrictions Precautions Precautions: Back Precaution Booklet Issued: Yes (comment) Precaution Comments: Educated pt and daughter on proper back precautions.  Needed v/c to maintain during activity. Required Braces or Orthoses: Spinal Brace Spinal Brace: Lumbar corset   Pertinent Vitals/Pain See vitals    ADL  Eating/Feeding: Performed;Independent Where Assessed - Eating/Feeding: Edge of bed Grooming: Performed;Wash/dry hands;Brushing hair;Set up Where Assessed - Grooming: Unsupported sitting Upper Body Bathing: Simulated;Set up Where Assessed - Upper Body Bathing: Unsupported sitting Lower Body Bathing: Simulated;Minimal assistance Where Assessed - Lower Body Bathing: Unsupported sit to stand Upper Body Dressing: Performed;Set up Where Assessed - Upper Body Dressing: Unsupported sitting Lower Body Dressing: Performed;Minimal assistance Where Assessed - Lower Body Dressing: Unsupported sit to stand Toilet Transfer: Performed;Min guard Toilet Transfer Method: Sit to Barista: Comfort height toilet;Grab bars Toileting - Clothing Manipulation and Hygiene: Performed;Min guard Where Assessed - Engineer, mining and Hygiene: Sit to stand from 3-in-1 or  toilet Equipment Used: Back brace;Rolling walker Transfers/Ambulation Related to ADLs: min guard with RW.   ADL Comments: Educated pt and daughter on techniques/strategies for ADLs/IADLs to assist in maintaining back precautions (removing throw rugs, placing ADL items at countertop height, crossing legs in order to access feet).  Pt requiring assist from daughter to thread LEs through undergarment due to pain (able to cross ankles over knees prior to sx).  Pt will have 24/7 assist and prefers to have family assist as needed rather than use AE. Occasionally requiring verbal cueing to avoid twisting, esp during ambulation and while sitting EOB. Pt and family demo'ing good understanding of back precautions.  Educated pt on tub transfer technique (side stepping with good leg first).  Pt requires increased time for all tasks due to pain and hypersensitivity to textures and pressure (MS related).    OT Diagnosis:    OT Problem List:   OT Treatment Interventions:     OT Goals    Visit Information  Last OT Received On: 12/09/12 Assistance Needed: +1    Subjective Data      Prior Functioning     Home Living Lives With: Spouse;Family Available Help at Discharge: Family;Available 24 hours/day Type of Home: House Home Access: Stairs to enter [employer] of Steps: 2 Home Layout: One level Bathroom Shower/Tub: Engineer, manufacturing systems: Standard Home Adaptive Equipment: Walker - rolling;Straight cane;Wheelchair - manual;Shower chair with back Additional Comments: Previous history of MS, had lots of home adaptive equipment.  Recently donated much of the equipment, but educated on topic. Prior Function Level of Independence: Independent with assistive device(s) Able to Take Stairs?: Yes Communication Communication: No difficulties         Vision/Perception     Cognition  Cognition Arousal/Alertness: Awake/alert Behavior During  Therapy: WFL for tasks  assessed/performed Overall Cognitive Status: Within Functional Limits for tasks assessed    Extremity/Trunk Assessment Right Upper Extremity Assessment RUE ROM/Strength/Tone: [facility] for tasks assessed Left Upper Extremity Assessment LUE ROM/Strength/Tone: WFL for tasks assessed     Mobility Bed Mobility Bed Mobility: Rolling Left;Left Sidelying to Sit;Sitting - Scoot to [employer] of Bed;Sit to Sidelying Left Rolling Left: 5: Supervision Left Sidelying to Sit: 4: Min assist;HOB flat Supine to Sit: 4: Min assist;HOB flat Sitting - Scoot to Edge of Bed: 5: Supervision Details for Bed Mobility Assistance: Assist to support trunk OOB and LEs back into bed. Daughter and husband demo'ing independence in assisting pt with bed mobility. VCs for log roll technique. Transfers Transfers: Sit to Stand;Stand to Sit Sit to Stand: 4: Min guard;From bed;From toilet;With upper extremity assist Stand to Sit: 5: Supervision;To toilet;To bed;With upper extremity assist Details for Transfer Assistance: VCs for safe hand placement.  Increased time due to pain.     Exercise     Balance     End of Session OT - End of Session Equipment Utilized During Treatment: Gait belt;Back brace Activity Tolerance: Patient limited by fatigue;Patient limited by pain Patient left: in bed;with call bell/phone within reach;with family/visitor present;with nursing in room Nurse Communication: Mobility status;Patient requests pain meds  GO    12/09/2012 [name] OTR/L Pager [phone] Office [phone]  [name] 12/09/2012, 2:53 PM

## 2012-12-09 NOTE — Evaluation (Signed)
Physical Therapy Evaluation Patient Details Name: [name] MRN: [id number] DOB: 03/26/1951 Today's Date: 12/09/2012 Time: 4132-4401 PT Time Calculation (min): 34 min  PT Assessment / Plan / Recommendation Clinical Impression  Pt admitted s/p lumbar laminectomy, PLIF L5-S1.  Pt showed willingness to participate in all activites despite already walking this morning.  Provided education on back precautions to pt and family. Actue PT needed to address below problem list for pt saftey and functional mobility.    PT Assessment  Patient needs continued PT services    Follow Up Recommendations  Supervision for mobility/OOB;No PT follow up    Barriers to Discharge None      Frequency Min 5X/week    Precautions / Restrictions Precautions Precautions: Back Precaution Booklet Issued: Yes (comment) Precaution Comments: Educated pt and daughter on proper back precautions.  Needed v/c to maintain during activity. Required Braces or Orthoses: Spinal Brace Spinal Brace: Lumbar corset Restrictions Weight Bearing Restrictions: No   Pertinent Vitals/Pain Pt reported 8/10 pain prior to activity.  Repositioned after treatment.      Mobility  Bed Mobility Bed Mobility: Supine to Sit;Sit to Supine Supine to Sit: 4: Min assist Sit to Supine: 4: Min assist;4: Min guard Details for Bed Mobility Assistance: v/c to maintain back precautions. Minimal tactile. assistance to bring shoulders upright when supine to sit. Transfers Transfers: Sit to Stand;Stand to Sit Sit to Stand: 4: Min guard Stand to Sit: 5: Supervision Details for Transfer Assistance: Tactile and verbal cues to maintain precautions and gain upright posture sit to stand.  Verbal cues for precautions stand to sit. Ambulation/Gait Ambulation/Gait Assistance: 5: Supervision Ambulation Distance (Feet): 40 Feet Assistive device: Rolling walker Ambulation/Gait Assistance Details: v/c to relax shoulders and proper positioning in  walker.  Super vision needed for fatigue. Gait Pattern: Step-through pattern;Narrow base of support;Decreased stride length Gait velocity: decreased General Gait Details: decreased step height Stairs: No Wheelchair Mobility Wheelchair Mobility: No         PT Diagnosis: Acute pain  PT Problem List: Decreased activity tolerance;Decreased mobility;Pain PT Treatment Interventions: Gait training;Stair training;Functional mobility training;Patient/family education   PT Goals Acute Rehab PT Goals PT Goal Formulation: With patient Time For Goal Achievement: 12/21/12 Potential to Achieve Goals: Good Pt will go Supine/Side to Sit: with supervision PT Goal: Supine/Side to Sit - Progress: Goal set today Pt will go Sit to Supine/Side: with supervision PT Goal: Sit to Supine/Side - Progress: Goal set today Pt will Ambulate: >150 feet;with supervision PT Goal: Ambulate - Progress: Goal set today Pt will Go Up / Down Stairs: 1-2 stairs;with supervision PT Goal: Up/Down Stairs - Progress: Goal set today  Visit Information  Last PT Received On: 12/09/12 Assistance Needed: +1    Subjective Data  Subjective: Pt reported she was feeling ok, just lots of pain Patient Stated Goal: home   Prior Functioning  Home Living Lives With: Spouse;Other (Comment) (2 grandchildren) Available Help at Discharge: Family Type of Home: House Home Access: Stairs to enter Secretary/administrator of Steps: 2 Home Layout: One level Bathroom Shower/Tub: Health visitor: Standard Home Adaptive Equipment: Wheelchair - manual;Walker - rolling;Straight cane Additional Comments: Previous history of MS, had lots of home adaptive equipment.  Recently donated much of the equipment, but educated on topic. Prior Function Level of Independence: Independent with assistive device(s) Able to Take Stairs?: Yes Communication Communication: No difficulties    Cognition  Cognition Arousal/Alertness:  Awake/alert Behavior During Therapy: WFL for tasks assessed/performed Overall Cognitive Status: Within Functional  Limits for tasks assessed    Extremity/Trunk Assessment Right Lower Extremity Assessment RLE ROM/Strength/Tone: [facility] for tasks assessed Left Lower Extremity Assessment LLE ROM/Strength/Tone: [facility] for tasks assessed   Balance Balance Balance Assessed: Yes Static Sitting Balance Static Sitting - Balance Support: Bilateral upper extremity supported Static Sitting - Level of Assistance: 5: Stand by assistance Static Standing Balance Static Standing - Balance Support: Bilateral upper extremity supported Static Standing - Comment/# of Minutes: WFL for task assessed with rolling walker.  End of Session PT - End of Session Equipment Utilized During Treatment: Gait belt Activity Tolerance: Patient limited by fatigue Patient left: in bed;with call bell/phone within reach;with bed alarm set;with family/visitor present Nurse Communication: Mobility status  GP     Payton Doughty 12/09/2012, 9:17 AM Payton Doughty, PT Student

## 2012-12-09 NOTE — Progress Notes (Signed)
Foley removed at 8:30pm. Patient ambulated to the bathroom with walker x1 assist and voided at 3:00am

## 2012-12-09 NOTE — Progress Notes (Addendum)
INITIAL NUTRITION ASSESSMENT  DOCUMENTATION CODES Per approved criteria  -Not Applicable   INTERVENTION: 1. Ensure Complete po BID, each supplement provides 350 kcal and 13 grams of protein.  2. Discussed importance of nutrition, encouraged pt to consume 2 ensure supplements per day.  NUTRITION DIAGNOSIS: Increased nutrient needs related to recent surgery as evidenced by estimated needs.   Goal: Pt to meet >/= 90% of their estimated nutrition needs.   Monitor:  PO intake, supplement acceptance, weight trend   Reason for Assessment: Pt identified as at nutrition risk on the Malnutrition Screen Tool  62 y.o. female  Admitting Dx: Back Pain  ASSESSMENT: Spoke with pt and husband and daughter. Per pt she has been stable at 95 lb for awhile. She reports having trouble gaining weight. Pt eats muffin for Breakfast and goes out to lunch daily and eats a full meal most of the time. She does not eat dinner. She tries to drink 1 equate brand ensure daily.  Height: Ht Readings from Last 1 Encounters:  12/08/12 5\' 1"  (1.549 m)    Weight: Wt Readings from Last 1 Encounters:  12/08/12 104 lb (47.174 kg)    Ideal Body Weight: 47.7 kg  % Ideal Body Weight: 99%  Wt Readings from Last 10 Encounters:  12/08/12 104 lb (47.174 kg)  12/08/12 104 lb (47.174 kg)  11/30/12 96 lb 1.9 oz (43.6 kg)  11/30/12 97 lb 1.6 oz (44.044 kg)  10/17/10 104 lb (47.174 kg)  05/08/10 111 lb (50.349 kg)  03/20/10 110 lb 4 oz (50.009 kg)  10/29/09 113 lb (51.256 kg)  07/09/09 112 lb (50.803 kg)  06/25/09 113 lb (51.256 kg)    Usual Body Weight: 95 lb   % Usual Body Weight: > 100%  BMI:  Body mass index is 19.66 kg/(m^2). WNL  Estimated Nutritional Needs: Kcal: 1400-1600 Protein: 65-75 grams Fluid: >1.5 L/day  Skin: incision  Nutrition Focused Physical Exam:  Subcutaneous Fat:  Orbital Region: WNL Upper Arm Region: WNL Thoracic and Lumbar Region: WNL  Muscle:  Temple Region:  WNL Clavicle Bone Region: WNL, normal for pt Clavicle and Acromion Bone Region: WNL Scapular Bone Region: WNL Dorsal Hand: WNL Patellar Region: WNL Anterior Thigh Region: WNL Posterior Calf Region: WNL  Edema: NOT PRESENT  Diet Order: General  EDUCATION NEEDS: -Education needs addressed   Intake/Output Summary (Last 24 hours) at 12/09/12 0828 Last data filed at 12/09/12 0655  Gross per 24 hour  Intake   1800 ml  Output   1035 ml  Net    765 ml    Last BM: PTA   Labs:  No results found for this basename: NA, K, CL, CO2, BUN, CREATININE, CALCIUM, MG, PHOS, GLUCOSE,  in the last 168 hours  CBG (last 3)   Recent Labs  12/08/12 1751  GLUCAP 133*    Scheduled Meds: . acetaminophen  1,000 mg Intravenous Q6H  . aspirin EC  325 mg Oral Daily  . dexamethasone  4 mg Intravenous Q6H   Or  . dexamethasone  4 mg Oral Q6H  . DULoxetine  60 mg Oral QHS  . gabapentin  600 mg Oral QHS  . metoprolol tartrate  25 mg Oral BID  . pantoprazole  40 mg Oral BID WC  . senna  1 tablet Oral BID  . sodium chloride  3 mL Intravenous Q12H  . traZODone  150 mg Oral QHS    Continuous Infusions: . sodium chloride    . 0.9 % NaCl with  KCl 20 mEq / L 75 mL/hr at 12/08/12 2049    Past Medical History  Diagnosis Date  . Mixed hyperlipidemia   . MI (myocardial infarction)   . MS (multiple sclerosis)   . Type 2 diabetes mellitus   . Chronic hoarseness   . GERD (gastroesophageal reflux disease)   . Subclavian vein stenosis, left     Ststus post stenting - Dr. Excell Seltzer  . Coronary atherosclerosis of native coronary artery     BMS circumflex, DES LAD, CABG 2012  . Osteoporosis     Past Surgical History  Procedure Laterality Date  . Coronary artery bypass graft  February 2012    LIMA to LAD, SVG to OM, SVG to RCA and PDA  . Cholecystectomy    . Coronary angioplasty    . Tonsillectomy    . Wrist surgery      Kendell Bane RD, LDN, CNSC [phone] Pager [phone] After Hours  Pager

## 2012-12-09 NOTE — Progress Notes (Signed)
Pt medicated for pain ,, out of bed and ambulating with OT in hall way,  Surgical dressing noted new stained and dressing reinforced. Rn will continue to monitor pt  Azzie Roup Rn

## 2012-12-09 NOTE — Progress Notes (Signed)
Patient ID: [name], female   DOB: 11/04/1950, 62 y.o.   MRN: [id number] Subjective: Patient reports she's doing well. She has been up to the bathroom as well as ambulating in the hall. Back pain is appropriate, mild leg soreness or pain. Denies weakness  Objective: Vital signs in last 24 hours: Temp:  [97.5 F (36.4 C)-99.3 F (37.4 C)] 98 F (36.7 C) (05/02 1012) Pulse Rate:  [56-88] 68 (05/02 1012) Resp:  [7-20] 16 (05/02 1012) BP: (67-137)/(38-74) 99/45 mmHg (05/02 1012) SpO2:  [91 %-100 %] 93 % (05/02 1012) Weight:  [47.174 kg (104 lb)] 47.174 kg (104 lb) (05/01 2229)  Intake/Output from previous day: 05/01 0701 - 05/02 0700 In: 1800 [I.V.:1700; Blood:100] Out: 1035 [Urine:585; Drains:150; Blood:300] Intake/Output this shift:    Neurologic: Grossly normal  Lab Results: Lab Results  Component Value Date   WBC 8.1 11/30/2012   HGB 12.9 11/30/2012   HCT 37.2 11/30/2012   MCV 81.6 11/30/2012   PLT 232 11/30/2012   Lab Results  Component Value Date   INR 0.89 11/30/2012   BMET Lab Results  Component Value Date   NA 134* 11/30/2012   K 3.7 11/30/2012   CL 96 11/30/2012   CO2 28 11/30/2012   GLUCOSE 79 11/30/2012   BUN 7 11/30/2012   CREATININE 0.68 11/30/2012   CALCIUM 9.7 11/30/2012    Studies/Results: Dg Lumbar Spine 2-3 Views  12/08/2012  *RADIOLOGY REPORT*  Clinical Data: Back pain  DG C-ARM GT 120 MIN,LUMBAR SPINE - 2-3 VIEW  Technique:  C-arm fluoroscopic images were obtained intraoperatively and submitted for postoperative interpretation. Please see the performing provider's procedural report for the fluoroscopy time utilized.  Comparison: None.  Findings: C-arm films document L4-S1 PLIF.  IMPRESSION: Satisfactory position and alignment.   Original Report Authenticated By: Davonna Belling, M.D.    Dg C-arm Gt 120 Min  12/08/2012  *RADIOLOGY REPORT*  Clinical Data: Back pain  DG C-ARM GT 120 MIN,LUMBAR SPINE - 2-3 VIEW  Technique:  C-arm fluoroscopic images were  obtained intraoperatively and submitted for postoperative interpretation. Please see the performing provider's procedural report for the fluoroscopy time utilized.  Comparison: None.  Findings: C-arm films document L4-S1 PLIF.  IMPRESSION: Satisfactory position and alignment.   Original Report Authenticated By: Davonna Belling, M.D.     Assessment/Plan: Seems to be doing well this early. PT OT and pain control   LOS: 1 day    Trish Mancinelli S 12/09/2012, 10:23 AM

## 2012-12-10 NOTE — Progress Notes (Signed)
Physical Therapy Treatment Patient Details Name: ALANNA STORTI MRN: [id number] DOB: [birth year] Today's Date: 12/10/2012 Time: 4098-1191 PT Time Calculation (min): 33 min  PT Assessment / Plan / Recommendation Comments on Treatment Session  Pt moving well.  Progressing with PT goals at this date.  Increased ambulation distance & performed stairs this session.  Pt should be appropriate to d/c home from PT standpoint when MD feels medically ready.      Follow Up Recommendations  Supervision for mobility/OOB;No PT follow up     Does the patient have the potential to tolerate intense rehabilitation     Barriers to Discharge        Equipment Recommendations       Recommendations for Other Services    Frequency Min 5X/week   Plan Discharge plan remains appropriate    Precautions / Restrictions Precautions Precautions: Back Required Braces or Orthoses: Spinal Brace Spinal Brace: Lumbar corset Restrictions Weight Bearing Restrictions: No       Mobility  Bed Mobility Bed Mobility: Rolling Left;Left Sidelying to Sit;Sitting - Scoot to Edge of Bed Rolling Left: 5: Supervision Left Sidelying to Sit: 5: Supervision;HOB flat Sitting - Scoot to Edge of Bed: 5: Supervision Details for Bed Mobility Assistance: No physical (A) needed only increased time & cues to reinforce technique.   Transfers Transfers: Sit to Stand;Stand to Sit Sit to Stand: 5: Supervision;With upper extremity assist;With armrests;From bed;From chair/3-in-1 Stand to Sit: 5: Supervision;With upper extremity assist;With armrests;To chair/3-in-1 Details for Transfer Assistance: Cues for hand placement Ambulation/Gait Ambulation/Gait Assistance: 5: Supervision Ambulation Distance (Feet): 275 Feet Assistive device: Rolling walker Ambulation/Gait Assistance Details: Cues to relax.  Pt guarded & stiff, small steps, slow gait speed, but steady.   Gait Pattern: Step-through pattern;Decreased stride length (decreased step  height) Gait velocity: decreased General Gait Details: decreased step height Stairs: Yes Stairs Assistance: 4: Min guard;4: Min assist Stairs Assistance Details (indicate cue type and reason): Cues for technique.  Pt states she can hold onto doorframe for stability.  Pt's husband present & both pt + husband demonstrated safe technique.   Stair Management Technique: One rail Right;Forwards Number of Stairs: 2 (3x's.) Wheelchair Mobility Wheelchair Mobility: No      PT Goals Acute Rehab PT Goals PT Goal Formulation: With patient Time For Goal Achievement: 12/21/12 Potential to Achieve Goals: Good Pt will go Supine/Side to Sit: with supervision PT Goal: Supine/Side to Sit - Progress: Met Pt will go Sit to Supine/Side: with supervision Pt will Ambulate: >150 feet;with supervision PT Goal: Ambulate - Progress: Met Pt will Go Up / Down Stairs: 1-2 stairs;with supervision PT Goal: Up/Down Stairs - Progress: Progressing toward goal  Visit Information  Last PT Received On: 12/10/12 Assistance Needed: +1    Subjective Data      Cognition  Cognition Arousal/Alertness: Awake/alert Behavior During Therapy: WFL for tasks assessed/performed Overall Cognitive Status: Within Functional Limits for tasks assessed    Balance     End of Session PT - End of Session Equipment Utilized During Treatment: Gait belt;Back brace Activity Tolerance: Patient tolerated treatment well Patient left: in chair;with call bell/phone within reach;with family/visitor present Nurse Communication: Mobility status     Verdell Face, [place] [phone] 12/10/2012

## 2012-12-10 NOTE — Progress Notes (Signed)
Patient ID: [name], female   DOB: 1951-07-05, 62 y.o.   MRN: [id number] Subjective: Patient reports doing well  Objective: Vital signs in last 24 hours: Temp:  [98 F (36.7 C)-98.7 F (37.1 C)] 98.3 F (36.8 C) (05/03 0531) Pulse Rate:  [59-92] 66 (05/03 0531) Resp:  [16-18] 18 (05/03 0531) BP: (90-119)/(45-65) 116/50 mmHg (05/03 0531) SpO2:  [90 %-95 %] 94 % (05/03 0531)  Intake/Output from previous day: 05/02 0701 - 05/03 0700 In: 990 [P.O.:390; I.V.:600] Out: 92 [Urine:2; Drains:90] Intake/Output this shift:    Wound:clean and dry  Lab Results: No results found for this basename: WBC, HGB, HCT, PLT,  in the last 72 hours BMET No results found for this basename: NA, K, CL, CO2, GLUCOSE, BUN, CREATININE, CALCIUM,  in the last 72 hours  Studies/Results: Dg Lumbar Spine 2-3 Views  12/08/2012  *RADIOLOGY REPORT*  Clinical Data: Back pain  DG C-ARM GT 120 MIN,LUMBAR SPINE - 2-3 VIEW  Technique:  C-arm fluoroscopic images were obtained intraoperatively and submitted for postoperative interpretation. Please see the performing provider's procedural report for the fluoroscopy time utilized.  Comparison: None.  Findings: C-arm films document L4-S1 PLIF.  IMPRESSION: Satisfactory position and alignment.   Original Report Authenticated By: Davonna Belling, M.D.    Dg C-arm Gt 120 Min  12/08/2012  *RADIOLOGY REPORT*  Clinical Data: Back pain  DG C-ARM GT 120 MIN,LUMBAR SPINE - 2-3 VIEW  Technique:  C-arm fluoroscopic images were obtained intraoperatively and submitted for postoperative interpretation. Please see the performing provider's procedural report for the fluoroscopy time utilized.  Comparison: None.  Findings: C-arm films document L4-S1 PLIF.  IMPRESSION: Satisfactory position and alignment.   Original Report Authenticated By: Davonna Belling, M.D.     Assessment/Plan: Doing well POD 2. Says she would like to go home tomorrow. Will d/c her drain, and increase activity in  anticipation of that.   LOS: 2 days  as above   Reinaldo Meeker, MD 12/10/2012, 7:31 AM

## 2012-12-10 NOTE — Progress Notes (Signed)
Pt stated that the last time she received pain pills and muscle relaxant they didn't hardly anything, to help with pain.  Offered patient IV Dilaudid, to see if could get pain under better control again, and told her she could alternate pain pills with the IV pain medication, if need be.  Encouraged to go back to oral meds, as patient is hoping to go home in am.  Leona Carry RN

## 2012-12-11 DIAGNOSIS — Z7409 Other reduced mobility: Secondary | ICD-10-CM

## 2012-12-11 MED ORDER — OXYCODONE HCL 10 MG PO TABS: 10.0000 mg | ORAL_TABLET | ORAL | Status: DC | PRN

## 2012-12-11 NOTE — Progress Notes (Signed)
   CARE MANAGEMENT NOTE 12/11/2012  Patient:  [name], [name]   Account Number:  [id number]  Date Initiated:  12/09/2012  Documentation initiated by:  [facility]  Subjective/Objective Assessment:   admitted postop L5-S1 PLIF     Action/Plan:   PT/OT evals   Anticipated DC Date:  12/12/2012   Anticipated DC Plan:  HOME W HOME HEALTH SERVICES      DC Planning Services  CM consult      [facility] Choice  HOME HEALTH   Choice offered to / List presented to:  C-1 Patient   DME arranged  3-N-1  Levan Hurst      DME agency  [facility]     HH arranged  HH-2 PT      [facility] agency  [facility]   Status of service:  Completed, signed off Medicare Important Message given?   (If response is "NO", the following Medicare IM given date fields will be blank) Date Medicare IM given:   Date Additional Medicare IM given:    Discharge Disposition:  HOME W HOME HEALTH SERVICES  Per UR Regulation:  Reviewed for med. necessity/level of care/duration of stay  If discussed at Long Length of Stay Meetings, dates discussed:    Comments:  12/11/2012 1030 NCM spoke to pt and offered choice for [facility]. Pt requested [facility] for HH. Notified [facility] of pt's scheduled dc home today with HH. Contacted [facility] for DME for home. Isidoro Donning RN CCM Case Mgmt phone [phone]

## 2012-12-11 NOTE — Progress Notes (Signed)
Physical Therapy Treatment Patient Details Name: [name] MRN: [id number] DOB: 1950/12/05 Today's Date: 12/11/2012 Time: 2130-8657 PT Time Calculation (min): 16 min  PT Assessment / Plan / Recommendation Comments on Treatment Session  Pt demonstrates safe technique with functional mobility.  F/u recommendations updated to HHPT per pt's request.  RN notified.      Follow Up Recommendations  Home health PT;Supervision for mobility/OOB     Does the patient have the potential to tolerate intense rehabilitation     Barriers to Discharge        Equipment Recommendations       Recommendations for Other Services    Frequency Min 5X/week   Plan Discharge plan remains appropriate    Precautions / Restrictions Precautions Precautions: Back Precaution Comments: Reviewed back precautions Required Braces or Orthoses: Spinal Brace Spinal Brace: Lumbar corset Restrictions Weight Bearing Restrictions: No       Mobility  Bed Mobility Bed Mobility: Rolling Left;Left Sidelying to Sit;Sitting - Scoot to Edge of Bed;Sit to Sidelying Left Rolling Left: 6: Modified independent (Device/Increase time) Left Sidelying to Sit: 6: Modified independent (Device/Increase time);HOB flat Sitting - Scoot to Edge of Bed: 6: Modified independent (Device/Increase time) Sit to Sidelying Left: 6: Modified independent (Device/Increase time);HOB flat Transfers Transfers: Sit to Stand;Stand to Sit Sit to Stand: 6: Modified independent (Device/Increase time);With upper extremity assist;From bed Stand to Sit: 6: Modified independent (Device/Increase time);With upper extremity assist;To bed Ambulation/Gait Ambulation/Gait Assistance: 6: Modified independent (Device/Increase time) Ambulation Distance (Feet): 300 Feet Assistive device: Rolling walker Ambulation/Gait Assistance Details: Pt steady.  Cues only needed to relax UE's.   Gait Pattern: Step-through pattern;Decreased stride length Stairs:  No Wheelchair Mobility Wheelchair Mobility: No    Exercises     PT Diagnosis:    PT Problem List:   PT Treatment Interventions:     PT Goals Acute Rehab PT Goals Time For Goal Achievement: 12/21/12 Potential to Achieve Goals: Good Pt will go Supine/Side to Sit: with supervision PT Goal: Supine/Side to Sit - Progress: Met Pt will go Sit to Supine/Side: with supervision PT Goal: Sit to Supine/Side - Progress: Met Pt will Ambulate: >150 feet;with supervision PT Goal: Ambulate - Progress: Met Pt will Go Up / Down Stairs: 1-2 stairs;with supervision  Visit Information  Last PT Received On: 12/11/12 Assistance Needed: +1    Subjective Data      Cognition  Cognition Arousal/Alertness: Awake/alert Behavior During Therapy: WFL for tasks assessed/performed Overall Cognitive Status: Within Functional Limits for tasks assessed    Balance  Static Standing Balance Static Standing - Balance Support: No upper extremity supported Static Standing - Comment/# of Minutes: 2 mins  End of Session PT - End of Session Equipment Utilized During Treatment: Gait belt;Back brace Activity Tolerance: Patient tolerated treatment well Patient left: in bed;with call bell/phone within reach;with family/visitor present Nurse Communication: Mobility status   GP     Lara Mulch 12/11/2012, 9:41 AM

## 2012-12-11 NOTE — Progress Notes (Signed)
Subjective: Patient reports Overall she's doing well pains well-controlled ambulating voiding we'll plan  Objective: Vital signs in last 24 hours: Temp:  [97.3 F (36.3 C)-98.7 F (37.1 C)] 98.1 F (36.7 C) (05/04 0557) Pulse Rate:  [59-70] 64 (05/04 0557) Resp:  [16-20] 20 (05/04 0557) BP: (121-135)/(53-66) 129/66 mmHg (05/04 0557) SpO2:  [94 %-97 %] 94 % (05/04 0557)  Intake/Output from previous day:   Intake/Output this shift:    Awake alert neurologically stable  Lab Results: No results found for this basename: WBC, HGB, HCT, PLT,  in the last 72 hours BMET No results found for this basename: NA, K, CL, CO2, GLUCOSE, BUN, CREATININE, CALCIUM,  in the last 72 hours  Studies/Results: No results found.  Assessment/Plan: Discharge home  LOS: 3 days     Shelsey Rieth P 12/11/2012, 8:18 AM

## 2012-12-11 NOTE — Discharge Summary (Signed)
  Physician Discharge Summary  Patient ID: [name] MRN: [id number] DOB/AGE: [birth year] 60 y.o.  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  Consults: Significant Diagnostic Studies: Treatments: Left L4-5 L5-S1 Discharge Exam: Blood pressure 129/66, pulse 64, temperature 98.1 F (36.7 C), temperature source Oral, resp. rate 20, height 5\' 1"  (1.549 m), weight 47.174 kg (104 lb), SpO2 94.00%. Neurologically state  Disposition: Home  Discharge Orders   Future Orders Complete By Expires     Care order/instruction  As directed     Scheduling Instructions:      Please arrange HHPT        Medication List    TAKE these medications       aspirin EC 325 MG tablet  Take 325 mg by mouth daily.     CITRACAL PO  Take 1 Units by mouth daily. Chocolate chewable     DULoxetine 60 MG capsule  Commonly known as:  CYMBALTA  Take 60 mg by mouth at bedtime.     gabapentin 600 MG tablet  Commonly known as:  NEURONTIN  Take 600 mg by mouth at bedtime.     metoprolol tartrate 25 MG tablet  Commonly known as:  LOPRESSOR  Take 25 mg by mouth 2 (two) times daily.     nitroGLYCERIN 0.4 MG SL tablet  Commonly known as:  NITROSTAT  Place 0.4 mg under the tongue every 5 (five) minutes as needed for chest pain.     oxyCODONE 5 MG immediate release tablet  Commonly known as:  Oxy IR/ROXICODONE  Take 5 mg by mouth every 6 (six) hours as needed for pain.     Oxycodone HCl 10 MG Tabs  Take 1 tablet (10 mg total)  by mouth every 4 (four) hours as needed.     pantoprazole 40 MG tablet  Commonly known as:  PROTONIX  Take 40 mg by mouth 2 (two) times daily.     simvastatin 20 MG tablet  Commonly known as:  ZOCOR  Take 20 mg by mouth every evening.     traZODone 150 MG tablet  Commonly known as:  DESYREL  Take 150 mg by mouth at bedtime.     VITAMIN B 12 PO  Take 1 tablet by mouth daily.     VITAMIN B COMPLEX PO  Take 1 tablet by mouth daily.     VITAMIN D (CHOLECALCIFEROL) PO  Take 1 tablet by mouth daily.     vitamin E 100 UNIT capsule  Take 100 Units by mouth daily.           Follow-up Information   Follow up with Tia Alert, MD.   Contact information:   [address] [place] [address] [phone]       Signed: Sallee Hogrefe P 12/11/2012, 8:23 AM

## 2012-12-11 NOTE — Progress Notes (Signed)
Agree with updated d/c plans for HHPT.  [place], [place] DPT [phone]

## 2012-12-13 MED FILL — Sodium Chloride IV Soln 0.9%: INTRAVENOUS | Qty: 1000 | Status: AC

## 2012-12-13 MED FILL — Heparin Sodium (Porcine) Inj 1000 Unit/ML: INTRAMUSCULAR | Qty: 30 | Status: AC

## 2015-07-16 ENCOUNTER — Other Ambulatory Visit (HOSPITAL_COMMUNITY): Payer: Self-pay | Admitting: Endocrinology

## 2015-07-16 DIAGNOSIS — E0590 Thyrotoxicosis, unspecified without thyrotoxic crisis or storm: Secondary | ICD-10-CM

## 2015-07-23 ENCOUNTER — Encounter (HOSPITAL_COMMUNITY)
Admission: RE | Admit: 2015-07-23 | Discharge: 2015-07-23 | Disposition: A | Discharge status: Home / Self Care | Payer: Medicare Other | Source: Ambulatory Visit | Attending: Endocrinology | Admitting: Endocrinology

## 2015-07-23 ENCOUNTER — Encounter (HOSPITAL_COMMUNITY): Payer: Self-pay

## 2015-07-23 DIAGNOSIS — E0590 Thyrotoxicosis, unspecified without thyrotoxic crisis or storm: Secondary | ICD-10-CM | POA: Insufficient documentation

## 2015-07-23 HISTORY — DX: Unspecified asthma, uncomplicated: J45.909

## 2015-07-23 MED ORDER — SODIUM IODIDE I 131 CAPSULE
13.0000 | Freq: Once | INTRAVENOUS | Status: AC | PRN
  Administered 2015-07-23: 13 via ORAL

## 2015-07-24 ENCOUNTER — Encounter (HOSPITAL_COMMUNITY)
Admission: RE | Admit: 2015-07-24 | Discharge: 2015-07-24 | Disposition: A | Discharge status: Home / Self Care | Payer: Medicare Other | Source: Ambulatory Visit | Attending: Endocrinology | Admitting: Endocrinology

## 2015-07-24 DIAGNOSIS — E0590 Thyrotoxicosis, unspecified without thyrotoxic crisis or storm: Secondary | ICD-10-CM | POA: Diagnosis present

## 2015-07-24 MED ORDER — SODIUM PERTECHNETATE TC 99M INJECTION
10.0000 | Freq: Once | INTRAVENOUS | Status: AC | PRN
  Administered 2015-07-24: 10.8 via INTRAVENOUS

## 2015-08-02 ENCOUNTER — Other Ambulatory Visit: Payer: Self-pay | Admitting: Endocrinology

## 2015-08-02 DIAGNOSIS — E041 Nontoxic single thyroid nodule: Secondary | ICD-10-CM

## 2015-08-26 ENCOUNTER — Other Ambulatory Visit: Payer: Self-pay | Admitting: Endocrinology

## 2015-08-26 DIAGNOSIS — E041 Nontoxic single thyroid nodule: Secondary | ICD-10-CM

## 2015-08-27 ENCOUNTER — Other Ambulatory Visit: Payer: Self-pay | Admitting: Endocrinology

## 2015-08-27 ENCOUNTER — Inpatient Hospital Stay
Admission: RE | Admit: 2015-08-27 | Discharge: 2015-08-27 | Disposition: A | Discharge status: Home / Self Care | Payer: Self-pay | Source: Ambulatory Visit | Attending: Endocrinology | Admitting: Endocrinology

## 2015-08-27 DIAGNOSIS — E041 Nontoxic single thyroid nodule: Secondary | ICD-10-CM

## 2015-09-05 ENCOUNTER — Encounter (HOSPITAL_COMMUNITY): Payer: Self-pay

## 2015-09-05 ENCOUNTER — Ambulatory Visit (HOSPITAL_COMMUNITY)
Admission: RE | Admit: 2015-09-05 | Discharge: 2015-09-05 | Disposition: A | Discharge status: Home / Self Care | Payer: Medicare Other | Source: Ambulatory Visit | Attending: Endocrinology | Admitting: Endocrinology

## 2015-09-05 DIAGNOSIS — E042 Nontoxic multinodular goiter: Secondary | ICD-10-CM | POA: Insufficient documentation

## 2015-09-05 DIAGNOSIS — E041 Nontoxic single thyroid nodule: Secondary | ICD-10-CM

## 2015-09-05 MED ORDER — LIDOCAINE HCL (PF) 2 % IJ SOLN
INTRAMUSCULAR | Status: AC
  Administered 2015-09-05: 10 mL
  Filled 2015-09-05: qty 20

## 2015-09-05 MED ORDER — LIDOCAINE HCL (PF) 2 % IJ SOLN
10.0000 mL | Freq: Once | INTRAMUSCULAR | Status: AC
  Administered 2015-09-05: 10 mL

## 2015-09-05 NOTE — Discharge Instructions (Signed)

## 2015-09-05 NOTE — Procedures (Signed)
PreOperative Dx: Multiple thyroid nodules Postoperative Dx: Multiple thyroid nodules Procedure:   US guided FNA of BILATERAL thyroid nodule Radiologist:  Thornton Papas Anesthesia:  3.5 ml of 2% lidocaine Specimen:  FNA x 3 of large RT lobe mass and FNA x 3 of small LT nodule  EBL:   < 1 ml Complications: None

## 2015-10-14 ENCOUNTER — Ambulatory Visit: Payer: Self-pay | Admitting: Surgery

## 2015-10-17 ENCOUNTER — Encounter (HOSPITAL_COMMUNITY)
Admission: RE | Admit: 2015-10-17 | Discharge: 2015-10-17 | Disposition: A | Discharge status: Home / Self Care | Payer: Medicare Other | Source: Ambulatory Visit | Attending: Surgery | Admitting: Surgery

## 2015-10-17 ENCOUNTER — Encounter (HOSPITAL_COMMUNITY): Payer: Self-pay

## 2015-10-17 ENCOUNTER — Ambulatory Visit (HOSPITAL_COMMUNITY)
Admission: RE | Admit: 2015-10-17 | Discharge: 2015-10-17 | Disposition: A | Discharge status: Home / Self Care | Payer: Medicare Other | Source: Ambulatory Visit | Attending: Anesthesiology | Admitting: Anesthesiology

## 2015-10-17 DIAGNOSIS — Z01812 Encounter for preprocedural laboratory examination: Secondary | ICD-10-CM | POA: Insufficient documentation

## 2015-10-17 DIAGNOSIS — Z01818 Encounter for other preprocedural examination: Secondary | ICD-10-CM | POA: Insufficient documentation

## 2015-10-17 DIAGNOSIS — Z951 Presence of aortocoronary bypass graft: Secondary | ICD-10-CM | POA: Diagnosis not present

## 2015-10-17 DIAGNOSIS — Z01810 Encounter for preprocedural cardiovascular examination: Secondary | ICD-10-CM | POA: Diagnosis not present

## 2015-10-17 DIAGNOSIS — E079 Disorder of thyroid, unspecified: Secondary | ICD-10-CM | POA: Insufficient documentation

## 2015-10-17 DIAGNOSIS — R001 Bradycardia, unspecified: Secondary | ICD-10-CM | POA: Diagnosis not present

## 2015-10-17 LAB — BASIC METABOLIC PANEL
Anion gap: 11 (ref 5–15)
BUN: 8 mg/dL (ref 6–20)
CALCIUM: 9.4 mg/dL (ref 8.9–10.3)
CO2: 25 mmol/L (ref 22–32)
CREATININE: 0.88 mg/dL (ref 0.44–1.00)
Chloride: 106 mmol/L (ref 101–111)
GFR calc non Af Amer: >60 mL/min (ref >60)
GLUCOSE: 95 mg/dL (ref 65–99)
Potassium: 3.6 mmol/L (ref 3.5–5.1)
Sodium: 142 mmol/L (ref 135–145)

## 2015-10-17 LAB — CBC
HCT: 37.7 % (ref 36.0–46.0)
Hemoglobin: 11.9 g/dL — ABNORMAL LOW (ref 12.0–15.0)
MCH: 26.9 pg (ref 26.0–34.0)
MCHC: 31.6 g/dL (ref 30.0–36.0)
MCV: 85.3 fL (ref 78.0–100.0)
PLATELETS: 205 10*3/uL (ref 150–400)
RBC: 4.42 MIL/uL (ref 3.87–5.11)
RDW: 14.1 % (ref 11.5–15.5)
WBC: 6.1 10*3/uL (ref 4.0–10.5)

## 2015-10-17 NOTE — Pre-Procedure Instructions (Signed)
    [name]  10/17/2015      [facility] - [place], [place] [address] Phone: [phone] Fax: [phone]  [place], [place] [address] [place] [address] Phone: [phone] Fax: [phone]    Your procedure is scheduled on October 21, 2015 .  Report to [facility] Admitting at 9:30 A.M.  Call this number if you have problems the morning of surgery:  [phone]   Remember:  Do not eat food or drink liquids after midnight.  Take these medicines the morning of surgery with A SIP OF WATER : buPROPion (WELLBUTRIN), metoprolol tartrate (LOPRESSOR), pantoprazole (PROTONIX),  HYDROcodone-acetaminophen ([place]) ;  PROAIR HFA - if needed   Do not wear jewelry, make-up or nail polish.  Do not wear lotions, powders, or perfumes.  You may wear deodorant.  Do not shave 48 hours prior to surgery.    Do not bring valuables to the hospital.  [facility] is not responsible for any belongings or valuables.  Contacts, dentures or bridgework may not be worn into surgery.  Leave your suitcase in the car.  After surgery it may be brought to your room.  For patients admitted to the hospital, discharge time will be determined by your treatment team.  Patients discharged the day of surgery will not be allowed to drive home.   Name and phone number of your driver:    Special instructions:  Preparing for surgery   Please read over the following fact sheets that you were given. Pain Booklet, Coughing and Deep Breathing and Surgical Site Infection Prevention

## 2015-10-17 NOTE — Progress Notes (Signed)
Pt with known CAD, CABG 09/2010, last cardiology visit 11/2012 Dr Domenic Polite. Pt denies CP, SOB  Kabbe, PA notified

## 2015-10-17 NOTE — Progress Notes (Signed)
Anesthesia Chart Review:  Pt is a 65 year old female scheduled for total thyroidectomy on 10/21/2015 with Dr. Harlow Asa.   PCP is Dr. Manuella Ghazi. Last office visit with cardiology was 11/30/12 with Dr. Rozann Lesches.   PMH includes:  CAD/MI (s/p LAD/Cx stent '09 and CABG (LIMA to LAD, SVG to [facility] and PDA, SVG to OM2) 09/2010), CHF, DM, hyperlipidemia, multiple sclerosis, chronic hoarseness, GERD, left subclavian vein stenosis, tonsillectomy, cholecystectomy. Current smoker. BMI 19. S/p L4-S1 PLIF 12/08/12.   Medications include: metoprolol, protonix, albuterol, simvastatin.  Preoperative labs reviewed.    Chest x-ray 10/17/15 pending.   EKG 10/17/15: sinus bradycardia (57 bpm)  Intra-op (CABG) TEE on 09/26/10 showed normal left ventricular systolic function. Possibly a very small atrial septal defect.  2D echo on 09/23/10 showed normal left ventricular systolic function with estimated EF 55-60%. Normal wall motion. Normal diastolic function. Trivial aortic regurgitation. Mild mitral regurgitation. There was increased thickness of the atrial septum, consistent with lipomatous hypertrophy.  Her last cardiac cath was on 09/22/10 prior to CABG.  Pt reports exercising 5 days a week at the gym; most days are stretching/toning on a mat, but twice a week she walks on a treadmill for an hour. She denies any symptoms with activity or at rest.   Reviewed case with Dr. Tobias Alexander. Pt will need cardiac clearance prior to surgery. Notified Bernie in Dr. Gala Lewandowsky office.   Willeen Cass, FNP-BC [facility] Stay [facility]/Anesthesiology Phone: [phone] 10/17/2015 4:47 PM

## 2015-10-21 DIAGNOSIS — Z85820 Personal history of malignant melanoma of skin: Secondary | ICD-10-CM | POA: Diagnosis not present

## 2015-10-21 DIAGNOSIS — G35 Multiple sclerosis: Secondary | ICD-10-CM | POA: Diagnosis not present

## 2015-10-21 DIAGNOSIS — J449 Chronic obstructive pulmonary disease, unspecified: Secondary | ICD-10-CM | POA: Diagnosis not present

## 2015-10-21 DIAGNOSIS — F329 Major depressive disorder, single episode, unspecified: Secondary | ICD-10-CM | POA: Diagnosis not present

## 2015-10-21 DIAGNOSIS — E1151 Type 2 diabetes mellitus with diabetic peripheral angiopathy without gangrene: Secondary | ICD-10-CM | POA: Diagnosis not present

## 2015-10-21 DIAGNOSIS — K219 Gastro-esophageal reflux disease without esophagitis: Secondary | ICD-10-CM | POA: Diagnosis not present

## 2015-10-21 DIAGNOSIS — I2510 Atherosclerotic heart disease of native coronary artery without angina pectoris: Secondary | ICD-10-CM | POA: Diagnosis not present

## 2015-10-21 DIAGNOSIS — E0590 Thyrotoxicosis, unspecified without thyrotoxic crisis or storm: Secondary | ICD-10-CM | POA: Diagnosis present

## 2015-10-21 DIAGNOSIS — F419 Anxiety disorder, unspecified: Secondary | ICD-10-CM | POA: Diagnosis not present

## 2015-10-21 DIAGNOSIS — E0520 Thyrotoxicosis with toxic multinodular goiter without thyrotoxic crisis or storm: Secondary | ICD-10-CM | POA: Diagnosis not present

## 2015-10-21 DIAGNOSIS — Z951 Presence of aortocoronary bypass graft: Secondary | ICD-10-CM | POA: Diagnosis not present

## 2015-10-21 DIAGNOSIS — I252 Old myocardial infarction: Secondary | ICD-10-CM | POA: Diagnosis not present

## 2015-10-21 DIAGNOSIS — E7800 Pure hypercholesterolemia, unspecified: Secondary | ICD-10-CM | POA: Diagnosis not present

## 2015-10-21 DIAGNOSIS — I10 Essential (primary) hypertension: Secondary | ICD-10-CM | POA: Diagnosis not present

## 2015-10-21 DIAGNOSIS — F17210 Nicotine dependence, cigarettes, uncomplicated: Secondary | ICD-10-CM | POA: Diagnosis not present

## 2015-10-21 DIAGNOSIS — Z79899 Other long term (current) drug therapy: Secondary | ICD-10-CM | POA: Diagnosis not present

## 2015-10-23 ENCOUNTER — Ambulatory Visit (INDEPENDENT_AMBULATORY_CARE_PROVIDER_SITE_OTHER): Payer: Medicare Other | Admitting: Cardiology

## 2015-10-23 ENCOUNTER — Encounter (INDEPENDENT_AMBULATORY_CARE_PROVIDER_SITE_OTHER): Payer: Self-pay

## 2015-10-23 ENCOUNTER — Encounter: Payer: Self-pay | Admitting: Cardiology

## 2015-10-23 VITALS — BP 128/70 | HR 60 | Ht 61.0 in | Wt 101.0 lb

## 2015-10-23 DIAGNOSIS — I2510 Atherosclerotic heart disease of native coronary artery without angina pectoris: Secondary | ICD-10-CM

## 2015-10-23 DIAGNOSIS — E785 Hyperlipidemia, unspecified: Secondary | ICD-10-CM | POA: Diagnosis not present

## 2015-10-23 DIAGNOSIS — E119 Type 2 diabetes mellitus without complications: Secondary | ICD-10-CM

## 2015-10-23 DIAGNOSIS — Z01810 Encounter for preprocedural cardiovascular examination: Secondary | ICD-10-CM

## 2015-10-23 NOTE — Progress Notes (Signed)
Cardiology Office Note  Date: 10/23/2015   ID: [name], DOB [birth year], MRN [id number]  PCP: Monico Blitz, MD  Primary Cardiologist: Rozann Lesches, MD   Chief Complaint  Patient presents with  . Preoperative evaluation    History of Present Illness: [name] is a 65 y.o. female last seen in the office in April 2014. She is referred back to the office after a recent pre-anesthesia evaluation with Ms. Kabbe NP - I reviewed the note. She was scheduled to undergo a total thyroidectomy on March 13 with Dr. Harlow Asa, procedure canceled pending cardiology consultation. She is here today with her husband. From a cardiac perspective, she states that she has had no angina symptoms, no nitroglycerin use. She exercises 5 days a week at a local gym in [place], does a stretching/aerobic/weight class. Also walks on the treadmill at 2 miles an hour, sometimes for up to an hour. He reports NYHA class 1-2 dyspnea with typical activities.  I reviewed her medications which are outlined below. Current cardiac regimen includes Zocor, Lopressor, and as needed nitroglycerin. She underwent CABG in February 2012 as outlined below. She has not had follow-up ischemic testing in the last 5 years. I reviewed her ECG done recently.  Past Medical History  Diagnosis Date  . Mixed hyperlipidemia   . MI (myocardial infarction) ([place])   . MS (multiple sclerosis) ([place])   . Type 2 diabetes mellitus ([place])   . Chronic hoarseness   . GERD (gastroesophageal reflux disease)   . Subclavian vein stenosis, left     Status post stenting - Dr. Burt Knack  . Coronary atherosclerosis of native coronary artery     BMS circumflex, DES LAD, CABG 2012  . Osteoporosis   . Asthma     Past Surgical History  Procedure Laterality Date  . Coronary artery bypass graft  February 2012    LIMA to LAD, SVG to OM, SVG to RCA and PDA  . Cholecystectomy    . Coronary angioplasty    . Tonsillectomy    . Wrist surgery    . Back  surgery      Current Outpatient Prescriptions  Medication Sig Dispense Refill  . buPROPion (WELLBUTRIN) 75 MG tablet Take 75 mg by mouth 2 (two) times daily.    . Cyanocobalamin (VITAMIN B 12 PO) Take 1 tablet by mouth daily.    . DULoxetine (CYMBALTA) 60 MG capsule Take 60 mg by mouth at bedtime.    . gabapentin (NEURONTIN) 600 MG tablet Take 600-1,200 mg by mouth at bedtime.     Marland Kitchen HYDROcodone-acetaminophen (NORCO) 7.5-325 MG tablet Take 1 tablet by mouth 3 (three) times daily.    . metoprolol tartrate (LOPRESSOR) 25 MG tablet Take 25 mg by mouth 2 (two) times daily.    . nitroGLYCERIN (NITROSTAT) 0.4 MG SL tablet Place 0.4 mg under the tongue every 5 (five) minutes as needed for chest pain.     . pantoprazole (PROTONIX) 40 MG tablet Take 40 mg by mouth 2 (two) times daily.    Marland Kitchen PROAIR HFA 108 (90 Base) MCG/ACT inhaler Take 2 puffs by mouth every 8 (eight) hours as needed.    . simvastatin (ZOCOR) 20 MG tablet Take 20 mg by mouth every evening.    . traZODone (DESYREL) 150 MG tablet Take 150 mg by mouth at bedtime.    Marland Kitchen VITAMIN D, CHOLECALCIFEROL, PO Take 1 tablet by mouth daily.     No current facility-administered medications for this visit.  Allergies:  Morphine sulfate; Zolpidem tartrate; and Sulfa antibiotics   Social History: The patient  reports that she has been smoking Cigarettes.  She has been smoking about 0.50 packs per day. She does not have any smokeless tobacco history on file. She reports that she drinks alcohol. She reports that she does not use illicit drugs.   ROS:  Please see the history of present illness. Otherwise, complete review of systems is positive for fatigue, feels underweight.  All other systems are reviewed and negative.   Physical Exam: VS:  BP 128/70 mmHg  Pulse 60  Ht 5\' 1"  (1.549 m)  Wt 101 lb (45.813 kg)  BMI 19.09 kg/m2, BMI Body mass index is 19.09 kg/(m^2).  Wt Readings from Last 3 Encounters:  10/23/15 101 lb (45.813 kg)  10/17/15 99 lb  6.4 oz (45.088 kg)  12/08/12 104 lb (47.174 kg)    Thin woman in no acute distress. HEENT: Conjunctiva and lids normal, oropharynx clear. Neck: Supple, no elevated JVP or carotid bruits, no thyromegaly. Lungs: Clear to auscultation, nonlabored breathing at rest. Cardiac: Regular rate and rhythm, no S3 or significant systolic murmur, no pericardial rub. Thorax: Well-healed sternal incision. Abdomen: Soft, nontender, bowel sounds present. Extremities: No pitting edema, distal pulses 2+. Skin: Warm and dry. Musculoskeletal: No kyphosis. Neuropsychiatric: Alert and oriented x3, affect grossly appropriate.  ECG: I personally reviewed the recent tracing from 10/17/2015 which showed sinus bradycardia.  Recent Labwork: 10/17/2015: BUN 8; Creatinine, Ser 0.88; Hemoglobin 11.9*; Platelets 205; Potassium 3.6; Sodium 142   Other Studies Reviewed Today:  Echocardiogram 09/23/2010: LVEF 55-60%, trace aortic regurgitation, mild mitral regurgitation.  Assessment and Plan:  1. Preoperative cardiac evaluation in a 65 year old woman with history of multivessel CAD status post CABG in February 2012, hyperlipidemia, and type 2 diabetes mellitus. She is being considered for total thyroidectomy under general anesthesia by Dr. Harlow Asa. Recent ECG shows sinus bradycardia, she does not report any angina and describes regular exercise and activities without substantial limitations. She has not undergone objective ischemic testing in 5 years however. Plan will be to obtain a standard GXT to assess more objectively her functional status, although I suspect that her perioperative cardiac risk in general will be in the low range.  2. Hyperlipidemia on Zocor. She follows regularly with Dr. Manuella Ghazi.  3. Type 2 diabetes mellitus, managed by diet and exercise. She follows with Dr. Manuella Ghazi.  Current medicines were reviewed with the patient today.   Orders Placed This Encounter  Procedures  . Exercise Tolerance Test     Disposition: FU with me in 1 year.   Signed, Satira Sark, MD, [facility] 10/23/2015 9:21 AM    [facility] [place]. [address], [place], [place] [address] Phone: [phone]; Fax: [phone]

## 2015-10-23 NOTE — Patient Instructions (Signed)
Medication Instructions:  Your physician recommends that you continue on your current medications as directed. Please refer to the Current Medication list given to you today.   Labwork: NONE  Testing/Procedures: Your physician has requested that you have an exercise tolerance test. For further information please visit [email or web address]. Please also follow instruction sheet, as given.  * ON DAY OF STRESS TEST - DO NOT TAKE THE LOPRESSOR PRIOR TO THE TEST*   Follow-Up: Your physician wants you to follow-up in: 1 YEAR.  You will receive a reminder letter in the mail two months in advance. If you don't receive a letter, please call our office to schedule the follow-up appointment.   Any Other Special Instructions Will Be Listed Below (If Applicable). Thanks for choosing [facility]!!! '     If you need a refill on your cardiac medications before your next appointment, please call your pharmacy.

## 2015-11-04 ENCOUNTER — Encounter (HOSPITAL_COMMUNITY)
Admission: RE | Admit: 2015-11-04 | Discharge: 2015-11-04 | Disposition: A | Discharge status: Home / Self Care | Payer: Medicare Other | Source: Ambulatory Visit | Attending: Cardiology | Admitting: Cardiology

## 2015-11-04 ENCOUNTER — Encounter (HOSPITAL_COMMUNITY): Payer: Self-pay

## 2015-11-04 ENCOUNTER — Ambulatory Visit (HOSPITAL_COMMUNITY)
Admission: RE | Admit: 2015-11-04 | Discharge: 2015-11-04 | Disposition: A | Discharge status: Home / Self Care | Payer: Medicare Other | Source: Ambulatory Visit | Attending: Cardiology | Admitting: Cardiology

## 2015-11-04 DIAGNOSIS — I2510 Atherosclerotic heart disease of native coronary artery without angina pectoris: Secondary | ICD-10-CM

## 2015-11-04 DIAGNOSIS — Z01818 Encounter for other preprocedural examination: Secondary | ICD-10-CM

## 2015-11-04 DIAGNOSIS — Z01810 Encounter for preprocedural cardiovascular examination: Secondary | ICD-10-CM

## 2015-11-04 DIAGNOSIS — I252 Old myocardial infarction: Secondary | ICD-10-CM | POA: Insufficient documentation

## 2015-11-04 LAB — NM MYOCAR MULTI W/SPECT W/WALL MOTION / EF
LV dias vol: 79 mL (ref 46–106)
LV sys vol: 32 mL
Peak HR: 86 {beats}/min
RATE: 0
Rest HR: 60 {beats}/min
SDS: 8
SRS: 6
SSS: 14
TID: 1.25

## 2015-11-04 MED ORDER — REGADENOSON 0.4 MG/5ML IV SOLN
INTRAVENOUS | Status: AC
  Administered 2015-11-04: 0.4 mg via INTRAVENOUS
  Filled 2015-11-04: qty 5

## 2015-11-04 MED ORDER — TECHNETIUM TC 99M SESTAMIBI - CARDIOLITE
30.0000 | Freq: Once | INTRAVENOUS | Status: AC | PRN
  Administered 2015-11-04: 29.5 via INTRAVENOUS

## 2015-11-04 MED ORDER — SODIUM CHLORIDE 0.9% FLUSH
INTRAVENOUS | Status: AC
  Administered 2015-11-04: 10 mL via INTRAVENOUS
  Filled 2015-11-04: qty 10

## 2015-11-04 MED ORDER — TECHNETIUM TC 99M SESTAMIBI GENERIC - CARDIOLITE
10.0000 | Freq: Once | INTRAVENOUS | Status: AC | PRN
  Administered 2015-11-04: 10 via INTRAVENOUS

## 2015-11-06 ENCOUNTER — Telehealth: Payer: Self-pay

## 2015-11-06 NOTE — Telephone Encounter (Signed)
Called pt, no answer- lmtcb- 3/29-lm

## 2015-11-06 NOTE — Telephone Encounter (Signed)
-----   Message from Satira Sark, MD sent at 11/06/2015  1:56 PM EDT ----- Reviewed report. Patient seen recently for preoperative evaluation prior to thyroidectomy. Stress testing was low risk with normal LVEF. She should be able to proceed with surgery as planned. Please forward a copy of my note with this appended stress test report to the patient's surgeon.

## 2015-11-11 ENCOUNTER — Encounter (HOSPITAL_COMMUNITY): Payer: Self-pay

## 2015-11-11 ENCOUNTER — Encounter (HOSPITAL_COMMUNITY)
Admission: RE | Admit: 2015-11-11 | Discharge: 2015-11-11 | Disposition: A | Discharge status: Home / Self Care | Payer: Medicare Other | Source: Ambulatory Visit | Attending: Surgery | Admitting: Surgery

## 2015-11-11 DIAGNOSIS — E0590 Thyrotoxicosis, unspecified without thyrotoxic crisis or storm: Secondary | ICD-10-CM | POA: Insufficient documentation

## 2015-11-11 DIAGNOSIS — Z01812 Encounter for preprocedural laboratory examination: Secondary | ICD-10-CM | POA: Insufficient documentation

## 2015-11-11 HISTORY — DX: Myoneural disorder, unspecified: G70.9

## 2015-11-11 HISTORY — DX: Unspecified osteoarthritis, unspecified site: M19.90

## 2015-11-11 LAB — BASIC METABOLIC PANEL
Anion gap: 9 (ref 5–15)
BUN: 11 mg/dL (ref 6–20)
CHLORIDE: 103 mmol/L (ref 101–111)
CO2: 29 mmol/L (ref 22–32)
CREATININE: 0.93 mg/dL (ref 0.44–1.00)
Calcium: 9.3 mg/dL (ref 8.9–10.3)
Glucose, Bld: 101 mg/dL — ABNORMAL HIGH (ref 65–99)
Potassium: 4.3 mmol/L (ref 3.5–5.1)
SODIUM: 141 mmol/L (ref 135–145)

## 2015-11-11 LAB — CBC
HCT: 36.2 % (ref 36.0–46.0)
HEMOGLOBIN: 12.1 g/dL (ref 12.0–15.0)
MCH: 28.1 pg (ref 26.0–34.0)
MCHC: 33.4 g/dL (ref 30.0–36.0)
MCV: 84 fL (ref 78.0–100.0)
PLATELETS: 201 10*3/uL (ref 150–400)
RBC: 4.31 MIL/uL (ref 3.87–5.11)
RDW: 14.1 % (ref 11.5–15.5)
WBC: 6.1 10*3/uL (ref 4.0–10.5)

## 2015-11-11 NOTE — Progress Notes (Signed)
Patient was seen by Dr. Domenic Polite 11/06/2015 , noted pened at 2:47 pm.  "Stress testing was low risk with normal LVEF. She should be able to proceed with surgery as planned"

## 2015-11-11 NOTE — Pre-Procedure Instructions (Signed)
Giselda Goldbach Ives  11/11/2015      [facility] - [place], [place] - [place] [address] Phone: [phone] Fax: [phone]  [place], [place] [address] [place] [address] Phone: [phone] Fax: [phone]    Your procedure is scheduled on April  10th.  Monday   Report to [facility] Admitting at 8:00 AM             (Posted surgery time 10:00 - 12:00)   Call this number if you have problems the morning of surgery:  [phone]   Remember:  Do not eat food or drink liquids after midnight Sunday.  Take these medicines the morning of surgery with A SIP OF WATER : Wellbutrin, Lopressor, Protonix, Norco.  Please use inhaler the morning of surgery.              (4-5 days prior to surgery, STOP taking any herbal supplements, vitamins, anti-inflammatories, blood thinners)               Do not wear jewelry, make-up or nail polish.  Do not wear lotions, powders, or perfumes.  You may NOT wear deodorant the day of surgery.   Do not shave 48 hours prior to surgery.    Do not bring valuables to the hospital.  [facility] is not responsible for any belongings or valuables.  Contacts, dentures or bridgework may not be worn into surgery.  Leave your suitcase in the car.  After surgery it may be brought to your room. For patients admitted to the hospital, discharge time will be determined by your treatment team.  Name and phone number of your driver:     Please read over the following fact sheets that you were given. Pain Booklet, Coughing and Deep Breathing and Surgical Site Infection Prevention

## 2015-11-13 ENCOUNTER — Encounter (HOSPITAL_COMMUNITY): Payer: Self-pay | Admitting: Surgery

## 2015-11-13 DIAGNOSIS — D440 Neoplasm of uncertain behavior of thyroid gland: Secondary | ICD-10-CM | POA: Diagnosis present

## 2015-11-13 DIAGNOSIS — E042 Nontoxic multinodular goiter: Secondary | ICD-10-CM | POA: Diagnosis present

## 2015-11-13 DIAGNOSIS — E0590 Thyrotoxicosis, unspecified without thyrotoxic crisis or storm: Secondary | ICD-10-CM | POA: Diagnosis present

## 2015-11-13 NOTE — H&P (Signed)
General Surgery [facility]  [name] DOB: 1951/04/14 Married / Language: English / Race: White Female  History of Present Illness  The patient is a 65 year old female who presents with hyperthyroidism.  Patient is referred by Dr. Jacelyn Pi for consultation and treatment of hyperthyroidism and thyroid neoplasm of uncertain behavior. Patient has had a history of hyperthyroidism dating back 7 day years. She has symptoms of tremor, weight loss, feeling cold, and chronic fatigue. She is currently taking low-dose beta blocker. She has had no prior head or neck surgery but she does have a carotid stent in place. She also has a history of coronary artery disease and underwent coronary artery bypass grafting. She is no longer regularly followed by her cardiologist. There is a family history of thyroid disease in her sister. Details are not known. There is no family history of malignancy. There is no family history of other endocrine neoplasms. Patient underwent nuclear medicine thyroid scan in December 2016. This demonstrated a cold nodule. Patient underwent ultrasound which showed bilateral thyroid nodules. Subsequent fine-needle aspiration biopsy shows the left thyroid nodule to be benign, but the dominant right thyroid nodule had cytologic atypia, Bethesda category III. Patient now presents to discuss thyroidectomy for management of hyperthyroidism and for definitive diagnosis of thyroid neoplasm of uncertain behavior.   Other Problems Anxiety Disorder Back Pain Bladder Problems Chest pain Cholelithiasis Chronic Obstructive Lung Disease Congestive Heart Failure Depression Gastroesophageal Reflux Disease Hemorrhoids High blood pressure Hypercholesterolemia Melanoma Migraine Headache Myocardial infarction Other disease, cancer, significant illness Thyroid Disease  Past Surgical History Breast Biopsy Left. Breast Mass;  Local Excision Left. Carotid Artery Surgery Left. Colon Polyp Removal - Colonoscopy Coronary Artery Bypass Graft Gallbladder Surgery - Laparoscopic Hysterectomy (not due to cancer) - Partial Oral Surgery Spinal Surgery - Lower Back Tonsillectomy  Diagnostic Studies History Colonoscopy 5-10 years ago Mammogram 1-3 years ago Pap Smear >5 years ago  Allergies Morphine Derivatives Sulfa Antibiotics Nausea, Vomiting.  Medication History Hydrocodone-Acetaminophen (7.5-325MG  Tablet, Oral) Active. DULoxetine HCl (60MG  Capsule DR Part, Oral) Active. Metoprolol Tartrate (25MG  Tablet, Oral) Active. Pantoprazole Sodium (40MG  Tablet DR, Oral) Active. Simvastatin (20MG  Tablet, Oral) Active. TraZODone HCl (150MG  Tablet, Oral) Active. Citracal Plus (Oral) Active. Nitrostat (0.4MG  Tab Sublingual, Sublingual) Active. Vitamin B 12 (50MCG Tablet, Oral) Active. Neurontin (600MG  Tablet, Oral) Active. Vitamin D (Cholecalciferol) (1000UNIT Capsule, Oral) Active. Medications Reconciled  Social History Alcohol use Remotely quit alcohol use. Caffeine use Carbonated beverages, Coffee, Tea. Illicit drug use Remotely quit drug use. Tobacco use Current every day smoker.  Family History Alcohol Abuse Daughter, Sister, Son. Arthritis Father, Sister. Bleeding disorder Family Members In General. Breast Cancer Sister. Cerebrovascular Accident Sister. Cervical Cancer Family Members In General. Colon Polyps Sister. Depression Daughter, Sister, Son. Diabetes Mellitus Father. Heart Disease Father. Hypertension Father, Sister, Son. Kidney Disease Mother. Malignant Neoplasm Of Pancreas Mother. Migraine Headache Daughter, Mother, Tora Duck, Pandora Leiter. Ovarian Cancer Mother. Prostate Cancer Father. Respiratory Condition Father. Thyroid problems Mother, Sister.  Pregnancy / Birth History Age at menarche 72 years. Age of menopause 26-50 Contraceptive History  Depo-provera, Intrauterine device, Oral contraceptives. Gravida 3 Irregular periods Maternal age 27-30 Para 2  Review of Systems General Present- Appetite Loss, Chills, Fatigue and Weight Loss. Not Present- Fever, Night Sweats and Weight Gain. Skin Present- Dryness. Not Present- Change in Wart/Mole, Hives, Jaundice, New Lesions, Non-Healing Wounds, Rash and Ulcer. HEENT Present- Ringing in the Ears, Seasonal Allergies, Sore Throat and Wears glasses/contact lenses. Not Present- Earache, Hearing Loss,  Hoarseness, Nose Bleed, Oral Ulcers, Sinus Pain, Visual Disturbances and Yellow Eyes. Respiratory Present- Difficulty Breathing and Snoring. Not Present- Bloody sputum, Chronic Cough and Wheezing. Breast Not Present- Breast Mass, Breast Pain, Nipple Discharge and Skin Changes. Cardiovascular Present- Shortness of Breath. Not Present- Chest Pain, Difficulty Breathing Lying Down, Leg Cramps, Palpitations, Rapid Heart Rate and Swelling of Extremities. Gastrointestinal Present- Constipation, Difficulty Swallowing and Excessive gas. Not Present- Abdominal Pain, Bloating, Bloody Stool, Change in Bowel Habits, Chronic diarrhea, Gets full quickly at meals, Hemorrhoids, Indigestion, Nausea, Rectal Pain and Vomiting. Female Genitourinary Present- Urgency. Not Present- Frequency, Nocturia, Painful Urination and Pelvic Pain. Musculoskeletal Present- Back Pain, Joint Pain and Muscle Weakness. Not Present- Joint Stiffness, Muscle Pain and Swelling of Extremities. Neurological Present- Decreased Memory, Numbness and Weakness. Not Present- Fainting, Headaches, Seizures, Tingling, Tremor and Trouble walking. Psychiatric Present- Anxiety and Depression. Not Present- Bipolar, Change in Sleep Pattern, Fearful and Frequent crying. Endocrine Present- Cold Intolerance. Not Present- Excessive Hunger, Hair Changes, Heat Intolerance, Hot flashes and New Diabetes. Hematology Present- Gland problems. Not Present- Easy  Bruising, Excessive bleeding, HIV and Persistent Infections.  Vitals Weight: 100.2 lb Height: 61in Body Surface Area: 1.41 m Body Mass Index: 18.93 kg/m  Temp.: 98.71F(Oral)  Pulse: 55 (Regular)  BP: 98/64 (Sitting, Left Arm, Standard)  Physical Exam  General - appears comfortable, no distress; not diaphorectic  HEENT - normocephalic; sclerae clear, gaze conjugate; mucous membranes moist, dentition good; voice normal  Neck - asymmetric on extension; no palpable anterior or posterior cervical adenopathy; dominant mass right thyroid lobe occupying most of the lobe measuring at least 3 cm in size, smooth, firm, mildly tender; left thyroid lobe with palpable nodule, smooth, firm, mildly tender  Chest - clear bilaterally without rhonchi, rales, or wheeze  Cor - regular rhythm with normal rate; no significant murmur  Ext - non-tender without significant edema or lymphedema  Neuro - grossly intact; moderate bilateral tremor  Assessment & Plan  HYPERTHYROIDISM (E05.90) MULTIPLE THYROID NODULES (E04.2) NEOPLASM OF UNCERTAIN BEHAVIOR OF THYROID GLAND (D44.0)  Patient presents with bilateral thyroid nodules and hyperthyroidism. Biopsy of the dominant nodule shows cytologic atypia. Patient is referred by her endocrinologist for consideration of total thyroidectomy. Written literature on thyroid surgery is provided to the patient and her husband for review at home.  I discussed total thyroidectomy in detail with the patient. We have discussed risk and benefits including the risk of injury to recurrent laryngeal nerves and parathyroid glands. We have discussed the need for lifelong thyroid hormone replacement. We have discussed the hospital stay to be anticipated, the surgical incision to be employed, and her postoperative recovery. Patient understands and wishes to proceed with surgery in the near future.  The risks and benefits of the procedure have been discussed at length with  the patient. The patient understands the proposed procedure, potential alternative treatments, and the course of recovery to be expected. All of the patient's questions have been answered at this time. The patient wishes to proceed with surgery.  Earnstine Regal, MD, [facility] Office: [phone]

## 2015-11-14 ENCOUNTER — Ambulatory Visit: Payer: Self-pay | Admitting: Surgery

## 2015-11-15 MED ORDER — CEFAZOLIN SODIUM-DEXTROSE 2-4 GM/100ML-% IV SOLN
2.0000 g | INTRAVENOUS | Status: AC
  Administered 2015-11-18: 2 g via INTRAVENOUS
  Filled 2015-11-15: qty 100

## 2015-11-18 ENCOUNTER — Encounter (HOSPITAL_COMMUNITY)
Admission: RE | Disposition: A | Discharge status: Home / Self Care | Payer: Self-pay | Source: Ambulatory Visit | Attending: Surgery

## 2015-11-18 ENCOUNTER — Observation Stay (HOSPITAL_COMMUNITY)
Admission: RE | Admit: 2015-11-18 | Discharge: 2015-11-19 | Disposition: A | Discharge status: Home / Self Care | Payer: Medicare Other | Source: Ambulatory Visit | Attending: Surgery | Admitting: Surgery

## 2015-11-18 ENCOUNTER — Ambulatory Visit (HOSPITAL_COMMUNITY): Payer: Medicare Other | Admitting: Emergency Medicine

## 2015-11-18 ENCOUNTER — Encounter (HOSPITAL_COMMUNITY): Payer: Self-pay | Admitting: Certified Registered Nurse Anesthetist

## 2015-11-18 ENCOUNTER — Ambulatory Visit (HOSPITAL_COMMUNITY): Payer: Medicare Other | Admitting: Certified Registered Nurse Anesthetist

## 2015-11-18 DIAGNOSIS — E0520 Thyrotoxicosis with toxic multinodular goiter without thyrotoxic crisis or storm: Secondary | ICD-10-CM | POA: Diagnosis not present

## 2015-11-18 DIAGNOSIS — Z85820 Personal history of malignant melanoma of skin: Secondary | ICD-10-CM | POA: Insufficient documentation

## 2015-11-18 DIAGNOSIS — Z951 Presence of aortocoronary bypass graft: Secondary | ICD-10-CM | POA: Insufficient documentation

## 2015-11-18 DIAGNOSIS — I2510 Atherosclerotic heart disease of native coronary artery without angina pectoris: Secondary | ICD-10-CM | POA: Insufficient documentation

## 2015-11-18 DIAGNOSIS — F17210 Nicotine dependence, cigarettes, uncomplicated: Secondary | ICD-10-CM | POA: Insufficient documentation

## 2015-11-18 DIAGNOSIS — E0590 Thyrotoxicosis, unspecified without thyrotoxic crisis or storm: Secondary | ICD-10-CM | POA: Diagnosis present

## 2015-11-18 DIAGNOSIS — Z79899 Other long term (current) drug therapy: Secondary | ICD-10-CM | POA: Insufficient documentation

## 2015-11-18 DIAGNOSIS — F329 Major depressive disorder, single episode, unspecified: Secondary | ICD-10-CM | POA: Insufficient documentation

## 2015-11-18 DIAGNOSIS — G35 Multiple sclerosis: Secondary | ICD-10-CM | POA: Insufficient documentation

## 2015-11-18 DIAGNOSIS — E1151 Type 2 diabetes mellitus with diabetic peripheral angiopathy without gangrene: Secondary | ICD-10-CM | POA: Insufficient documentation

## 2015-11-18 DIAGNOSIS — I10 Essential (primary) hypertension: Secondary | ICD-10-CM | POA: Insufficient documentation

## 2015-11-18 DIAGNOSIS — E7800 Pure hypercholesterolemia, unspecified: Secondary | ICD-10-CM | POA: Insufficient documentation

## 2015-11-18 DIAGNOSIS — E042 Nontoxic multinodular goiter: Secondary | ICD-10-CM | POA: Diagnosis present

## 2015-11-18 DIAGNOSIS — J449 Chronic obstructive pulmonary disease, unspecified: Secondary | ICD-10-CM | POA: Diagnosis not present

## 2015-11-18 DIAGNOSIS — I252 Old myocardial infarction: Secondary | ICD-10-CM | POA: Insufficient documentation

## 2015-11-18 DIAGNOSIS — K219 Gastro-esophageal reflux disease without esophagitis: Secondary | ICD-10-CM | POA: Insufficient documentation

## 2015-11-18 DIAGNOSIS — D440 Neoplasm of uncertain behavior of thyroid gland: Secondary | ICD-10-CM | POA: Diagnosis present

## 2015-11-18 DIAGNOSIS — F419 Anxiety disorder, unspecified: Secondary | ICD-10-CM | POA: Insufficient documentation

## 2015-11-18 HISTORY — PX: THYROIDECTOMY: SHX17

## 2015-11-18 LAB — GLUCOSE, CAPILLARY
GLUCOSE-CAPILLARY: 108 mg/dL — AB (ref 65–99)
Glucose-Capillary: 97 mg/dL (ref 65–99)

## 2015-11-18 SURGERY — THYROIDECTOMY
Anesthesia: General | Site: Neck

## 2015-11-18 MED ORDER — 0.9 % SODIUM CHLORIDE (POUR BTL) OPTIME
TOPICAL | Status: DC | PRN
  Administered 2015-11-18: 1000 mL

## 2015-11-18 MED ORDER — PROPOFOL 10 MG/ML IV BOLUS
INTRAVENOUS | Status: DC | PRN
  Administered 2015-11-18: 100 mg via INTRAVENOUS

## 2015-11-18 MED ORDER — FENTANYL CITRATE (PF) 100 MCG/2ML IJ SOLN
INTRAMUSCULAR | Status: DC | PRN
  Administered 2015-11-18: 100 ug via INTRAVENOUS
  Administered 2015-11-18 (×3): 50 ug via INTRAVENOUS

## 2015-11-18 MED ORDER — DEXAMETHASONE SODIUM PHOSPHATE 4 MG/ML IJ SOLN
INTRAMUSCULAR | Status: DC | PRN
  Administered 2015-11-18: 4 mg via INTRAVENOUS

## 2015-11-18 MED ORDER — HYDROMORPHONE HCL 1 MG/ML IJ SOLN
0.2500 mg | INTRAMUSCULAR | Status: DC | PRN
  Administered 2015-11-18 (×4): 0.5 mg via INTRAVENOUS

## 2015-11-18 MED ORDER — HEMOSTATIC AGENTS (NO CHARGE) OPTIME
TOPICAL | Status: DC | PRN
  Administered 2015-11-18: 1 via TOPICAL

## 2015-11-18 MED ORDER — SUGAMMADEX SODIUM 500 MG/5ML IV SOLN
INTRAVENOUS | Status: DC | PRN
  Administered 2015-11-18: 90.8 mg via INTRAVENOUS

## 2015-11-18 MED ORDER — TRAZODONE HCL 150 MG PO TABS
150.0000 mg | ORAL_TABLET | Freq: Every day | ORAL | Status: DC
  Administered 2015-11-18: 150 mg via ORAL
  Filled 2015-11-18: qty 1

## 2015-11-18 MED ORDER — ARTIFICIAL TEARS OP OINT
TOPICAL_OINTMENT | OPHTHALMIC | Status: DC | PRN
  Administered 2015-11-18: 1 via OPHTHALMIC

## 2015-11-18 MED ORDER — DULOXETINE HCL 60 MG PO CPEP
60.0000 mg | ORAL_CAPSULE | Freq: Every day | ORAL | Status: DC
  Administered 2015-11-18: 60 mg via ORAL
  Filled 2015-11-18: qty 1

## 2015-11-18 MED ORDER — ONDANSETRON HCL 4 MG/2ML IJ SOLN
INTRAMUSCULAR | Status: DC | PRN
  Administered 2015-11-18: 4 mg via INTRAVENOUS

## 2015-11-18 MED ORDER — ROCURONIUM BROMIDE 50 MG/5ML IV SOLN
INTRAVENOUS | Status: AC
  Filled 2015-11-18: qty 1

## 2015-11-18 MED ORDER — METOPROLOL TARTRATE 25 MG PO TABS
25.0000 mg | ORAL_TABLET | Freq: Two times a day (BID) | ORAL | Status: DC
  Administered 2015-11-18 (×2): 25 mg via ORAL
  Filled 2015-11-18 (×2): qty 1

## 2015-11-18 MED ORDER — HYDROMORPHONE HCL 1 MG/ML IJ SOLN
INTRAMUSCULAR | Status: AC
  Filled 2015-11-18: qty 1

## 2015-11-18 MED ORDER — PANTOPRAZOLE SODIUM 40 MG PO TBEC
40.0000 mg | DELAYED_RELEASE_TABLET | Freq: Two times a day (BID) | ORAL | Status: DC
  Administered 2015-11-18: 40 mg via ORAL
  Filled 2015-11-18: qty 1

## 2015-11-18 MED ORDER — KCL IN DEXTROSE-NACL 20-5-0.45 MEQ/L-%-% IV SOLN
INTRAVENOUS | Status: DC
  Administered 2015-11-18: 16:00:00 via INTRAVENOUS
  Filled 2015-11-18: qty 1000

## 2015-11-18 MED ORDER — ONDANSETRON 4 MG PO TBDP: 4.0000 mg | ORAL_TABLET | Freq: Four times a day (QID) | ORAL | Status: DC | PRN

## 2015-11-18 MED ORDER — HYDROMORPHONE HCL 1 MG/ML IJ SOLN
1.0000 mg | INTRAMUSCULAR | Status: DC | PRN
  Administered 2015-11-18 (×2): 1 mg via INTRAVENOUS
  Filled 2015-11-18 (×2): qty 1

## 2015-11-18 MED ORDER — LIDOCAINE HCL (CARDIAC) 20 MG/ML IV SOLN
INTRAVENOUS | Status: DC | PRN
  Administered 2015-11-18: 20 mg via INTRAVENOUS
  Administered 2015-11-18: 80 mg via INTRATRACHEAL

## 2015-11-18 MED ORDER — CALCIUM CARBONATE 1250 (500 CA) MG PO TABS
2.0000 | ORAL_TABLET | Freq: Three times a day (TID) | ORAL | Status: DC
  Administered 2015-11-18 – 2015-11-19 (×2): 1000 mg via ORAL
  Filled 2015-11-18 (×3): qty 1

## 2015-11-18 MED ORDER — ONDANSETRON HCL 4 MG/2ML IJ SOLN
INTRAMUSCULAR | Status: AC
  Filled 2015-11-18: qty 2

## 2015-11-18 MED ORDER — OXYCODONE HCL 5 MG PO TABS
5.0000 mg | ORAL_TABLET | ORAL | Status: DC | PRN
  Administered 2015-11-18 – 2015-11-19 (×3): 10 mg via ORAL
  Filled 2015-11-18 (×3): qty 2

## 2015-11-18 MED ORDER — LIDOCAINE HCL (CARDIAC) 20 MG/ML IV SOLN
INTRAVENOUS | Status: AC
  Filled 2015-11-18: qty 10

## 2015-11-18 MED ORDER — DEXAMETHASONE SODIUM PHOSPHATE 4 MG/ML IJ SOLN
INTRAMUSCULAR | Status: AC
  Filled 2015-11-18: qty 1

## 2015-11-18 MED ORDER — PHENYLEPHRINE HCL 10 MG/ML IJ SOLN
10.0000 mg | INTRAMUSCULAR | Status: DC | PRN
  Administered 2015-11-18: 20 ug/min via INTRAVENOUS

## 2015-11-18 MED ORDER — MIDAZOLAM HCL 2 MG/2ML IJ SOLN
INTRAMUSCULAR | Status: AC
  Filled 2015-11-18: qty 2

## 2015-11-18 MED ORDER — BUPROPION HCL 75 MG PO TABS
75.0000 mg | ORAL_TABLET | Freq: Two times a day (BID) | ORAL | Status: DC
  Administered 2015-11-18: 75 mg via ORAL
  Filled 2015-11-18: qty 1

## 2015-11-18 MED ORDER — ROCURONIUM BROMIDE 100 MG/10ML IV SOLN
INTRAVENOUS | Status: DC | PRN
  Administered 2015-11-18: 40 mg via INTRAVENOUS

## 2015-11-18 MED ORDER — PHENYLEPHRINE HCL 10 MG/ML IJ SOLN
INTRAMUSCULAR | Status: DC | PRN
  Administered 2015-11-18 (×2): 80 ug via INTRAVENOUS
  Administered 2015-11-18: 40 ug via INTRAVENOUS

## 2015-11-18 MED ORDER — LIDOCAINE HCL 4 % EX SOLN: CUTANEOUS | Status: DC | PRN

## 2015-11-18 MED ORDER — EPHEDRINE SULFATE 50 MG/ML IJ SOLN
INTRAMUSCULAR | Status: DC | PRN
  Administered 2015-11-18 (×4): 5 mg via INTRAVENOUS

## 2015-11-18 MED ORDER — MEPERIDINE HCL 25 MG/ML IJ SOLN: 6.2500 mg | INTRAMUSCULAR | Status: DC | PRN

## 2015-11-18 MED ORDER — ACETAMINOPHEN 650 MG RE SUPP: 650.0000 mg | Freq: Four times a day (QID) | RECTAL | Status: DC | PRN

## 2015-11-18 MED ORDER — LACTATED RINGERS IV SOLN
INTRAVENOUS | Status: DC | PRN
  Administered 2015-11-18: 09:00:00 via INTRAVENOUS

## 2015-11-18 MED ORDER — FENTANYL CITRATE (PF) 250 MCG/5ML IJ SOLN
INTRAMUSCULAR | Status: AC
  Filled 2015-11-18: qty 5

## 2015-11-18 MED ORDER — ALBUTEROL SULFATE (2.5 MG/3ML) 0.083% IN NEBU: 3.0000 mL | INHALATION_SOLUTION | Freq: Four times a day (QID) | RESPIRATORY_TRACT | Status: DC | PRN

## 2015-11-18 MED ORDER — MIDAZOLAM HCL 2 MG/2ML IJ SOLN: 0.5000 mg | Freq: Once | INTRAMUSCULAR | Status: DC | PRN

## 2015-11-18 MED ORDER — GABAPENTIN 600 MG PO TABS
600.0000 mg | ORAL_TABLET | Freq: Every day | ORAL | Status: DC
  Administered 2015-11-18: 1200 mg via ORAL
  Filled 2015-11-18: qty 2

## 2015-11-18 MED ORDER — ACETAMINOPHEN 325 MG PO TABS: 650.0000 mg | ORAL_TABLET | Freq: Four times a day (QID) | ORAL | Status: DC | PRN

## 2015-11-18 MED ORDER — LACTATED RINGERS IV SOLN
INTRAVENOUS | Status: DC
  Administered 2015-11-18: 09:00:00 via INTRAVENOUS

## 2015-11-18 MED ORDER — ONDANSETRON HCL 4 MG/2ML IJ SOLN: 4.0000 mg | Freq: Four times a day (QID) | INTRAMUSCULAR | Status: DC | PRN

## 2015-11-18 MED ORDER — MIDAZOLAM HCL 5 MG/5ML IJ SOLN
INTRAMUSCULAR | Status: DC | PRN
  Administered 2015-11-18: 2 mg via INTRAVENOUS

## 2015-11-18 MED ORDER — PHENYLEPHRINE 40 MCG/ML (10ML) SYRINGE FOR IV PUSH (FOR BLOOD PRESSURE SUPPORT)
PREFILLED_SYRINGE | INTRAVENOUS | Status: AC
  Filled 2015-11-18: qty 10

## 2015-11-18 MED ORDER — SUGAMMADEX SODIUM 500 MG/5ML IV SOLN
INTRAVENOUS | Status: AC
  Filled 2015-11-18: qty 5

## 2015-11-18 SURGICAL SUPPLY — 58 items
ATTRACTOMAT 16X20 MAGNETIC DRP (DRAPES) ×3 IMPLANT
BENZOIN TINCTURE PRP APPL 2/3 (GAUZE/BANDAGES/DRESSINGS) ×3 IMPLANT
BLADE SURG 10 STRL SS (BLADE) ×3 IMPLANT
BLADE SURG 15 STRL LF DISP TIS (BLADE) ×1 IMPLANT
BLADE SURG 15 STRL SS (BLADE) ×2
BLADE SURG ROTATE 9660 (MISCELLANEOUS) IMPLANT
CANISTER SUCTION 2500CC (MISCELLANEOUS) ×3 IMPLANT
CHLORAPREP W/TINT 10.5 ML (MISCELLANEOUS) ×3 IMPLANT
CLIP TI MEDIUM 24 (CLIP) ×3 IMPLANT
CLIP TI WIDE RED SMALL 24 (CLIP) ×3 IMPLANT
CLOSURE WOUND 1/2 X4 (GAUZE/BANDAGES/DRESSINGS) ×1
CONT SPEC 4OZ CLIKSEAL STRL BL (MISCELLANEOUS) IMPLANT
COVER SURGICAL LIGHT HANDLE (MISCELLANEOUS) ×3 IMPLANT
CRADLE DONUT ADULT HEAD (MISCELLANEOUS) ×3 IMPLANT
DRAPE LAPAROTOMY 100X72 PEDS (DRAPES) ×3 IMPLANT
DRAPE UTILITY XL STRL (DRAPES) ×3 IMPLANT
ELECT CAUTERY BLADE 6.4 (BLADE) ×3 IMPLANT
ELECT REM PT RETURN 9FT ADLT (ELECTROSURGICAL) ×3
ELECTRODE REM PT RTRN 9FT ADLT (ELECTROSURGICAL) ×1 IMPLANT
GAUZE SPONGE 4X4 12PLY STRL (GAUZE/BANDAGES/DRESSINGS) ×3 IMPLANT
GAUZE SPONGE 4X4 16PLY XRAY LF (GAUZE/BANDAGES/DRESSINGS) ×3 IMPLANT
GLOVE BIO SURGEON STRL SZ 6.5 (GLOVE) ×4 IMPLANT
GLOVE BIO SURGEON STRL SZ8 (GLOVE) ×3 IMPLANT
GLOVE BIO SURGEONS STRL SZ 6.5 (GLOVE) ×2
GLOVE BIOGEL PI IND STRL 6.5 (GLOVE) ×1 IMPLANT
GLOVE BIOGEL PI IND STRL 7.0 (GLOVE) ×1 IMPLANT
GLOVE BIOGEL PI IND STRL 8 (GLOVE) ×1 IMPLANT
GLOVE BIOGEL PI INDICATOR 6.5 (GLOVE) ×2
GLOVE BIOGEL PI INDICATOR 7.0 (GLOVE) ×2
GLOVE BIOGEL PI INDICATOR 8 (GLOVE) ×2
GLOVE ECLIPSE 6.5 STRL STRAW (GLOVE) ×3 IMPLANT
GLOVE SURG ORTHO 8.0 STRL STRW (GLOVE) ×3 IMPLANT
GOWN STRL REUS W/ TWL LRG LVL3 (GOWN DISPOSABLE) ×1 IMPLANT
GOWN STRL REUS W/ TWL XL LVL3 (GOWN DISPOSABLE) ×2 IMPLANT
GOWN STRL REUS W/TWL LRG LVL3 (GOWN DISPOSABLE) ×2
GOWN STRL REUS W/TWL XL LVL3 (GOWN DISPOSABLE) ×4
HEMOSTAT SURGICEL 2X4 FIBR (HEMOSTASIS) ×3 IMPLANT
ILLUMINATOR WAVEGUIDE N/F (MISCELLANEOUS) ×3 IMPLANT
KIT BASIN OR (CUSTOM PROCEDURE TRAY) ×3 IMPLANT
KIT ROOM TURNOVER OR (KITS) ×3 IMPLANT
LIGHT WAVEGUIDE WIDE FLAT (MISCELLANEOUS) IMPLANT
NS IRRIG 1000ML POUR BTL (IV SOLUTION) ×3 IMPLANT
PACK SURGICAL SETUP 50X90 (CUSTOM PROCEDURE TRAY) ×3 IMPLANT
PAD ARMBOARD 7.5X6 YLW CONV (MISCELLANEOUS) ×3 IMPLANT
PENCIL BUTTON HOLSTER BLD 10FT (ELECTRODE) ×3 IMPLANT
SHEARS HARMONIC 9CM CVD (BLADE) ×3 IMPLANT
SPECIMEN JAR MEDIUM (MISCELLANEOUS) IMPLANT
SPONGE INTESTINAL PEANUT (DISPOSABLE) ×3 IMPLANT
STRIP CLOSURE SKIN 1/2X4 (GAUZE/BANDAGES/DRESSINGS) ×2 IMPLANT
SUT MNCRL AB 4-0 PS2 18 (SUTURE) ×3 IMPLANT
SUT SILK 2 0 (SUTURE) ×2
SUT SILK 2-0 18XBRD TIE 12 (SUTURE) ×1 IMPLANT
SUT VIC AB 3-0 SH 18 (SUTURE) ×3 IMPLANT
SYR BULB 3OZ (MISCELLANEOUS) ×3 IMPLANT
TOWEL OR 17X24 6PK STRL BLUE (TOWEL DISPOSABLE) ×3 IMPLANT
TOWEL OR 17X26 10 PK STRL BLUE (TOWEL DISPOSABLE) ×3 IMPLANT
TUBE CONNECTING 12'X1/4 (SUCTIONS) ×1
TUBE CONNECTING 12X1/4 (SUCTIONS) ×2 IMPLANT

## 2015-11-18 NOTE — Transfer of Care (Signed)
Immediate Anesthesia Transfer of Care Note  Patient: [name]  Procedure(s) Performed: Procedure(s): TOTAL THYROIDECTOMY (N/A)  Patient Location: PACU  Anesthesia Type:General  Level of Consciousness: awake, alert  and oriented  Airway & Oxygen Therapy: Patient Spontanous Breathing and Patient connected to face mask oxygen  Post-op Assessment: Report given to RN, Post -op Vital signs reviewed and stable and Patient moving all extremities X 4  Post vital signs: Reviewed and stable  Last Vitals:  Filed Vitals:   11/18/15 0815 11/18/15 1156  BP: 136/51 193/92  Pulse: 57 84  Temp: 36.7 C 37.1 C  Resp: 18 21    Complications: No apparent anesthesia complications

## 2015-11-18 NOTE — Anesthesia Postprocedure Evaluation (Signed)
Anesthesia Post Note  Patient: [name]  Procedure(s) Performed: Procedure(s) (LRB): TOTAL THYROIDECTOMY (N/A)  Patient location during evaluation: PACU Anesthesia Type: General Level of consciousness: awake and alert, oriented and patient cooperative Pain management: pain level controlled Vital Signs Assessment: post-procedure vital signs reviewed and stable Respiratory status: spontaneous breathing, nonlabored ventilation, respiratory function stable and patient connected to nasal cannula oxygen Cardiovascular status: blood pressure returned to baseline and stable Postop Assessment: no signs of nausea or vomiting Anesthetic complications: no    Last Vitals:  Filed Vitals:   11/18/15 1156 11/18/15 1215  BP: 193/92 186/91  Pulse: 84 79  Temp: 37.1 C   Resp: 21 21    Last Pain:  Filed Vitals:   11/18/15 1219  PainSc: 7                  Balian Schaller,E. Steen Bisig

## 2015-11-18 NOTE — Anesthesia Preprocedure Evaluation (Addendum)
Anesthesia Evaluation  Patient identified by MRN, date of birth, ID band Patient awake    Reviewed: Allergy & Precautions, NPO status , Patient's Chart, lab work & pertinent test results  History of Anesthesia Complications Negative for: history of anesthetic complications  Airway Mallampati: I  TM Distance: >3 FB Neck ROM: Full    Dental  (+) Edentulous Upper, Edentulous Lower   Pulmonary asthma , COPD,  COPD inhaler, Current Smoker,    breath sounds clear to auscultation       Cardiovascular hypertension, Pt. on medications and Pt. on home beta blockers (-) angina+ CAD, + Past MI, + Cardiac Stents, + CABG and + Peripheral Vascular Disease (subclavian stent)   Rhythm:Regular Rate:Normal  3/17 Stress:  Small infero-lat defect, EF 59%, no ischemia '12 TEE: normal LVF, possible small ASD   Neuro/Psych  Neuromuscular disease (MS)    GI/Hepatic Neg liver ROS, GERD  Medicated and Controlled,  Endo/Other  diabetes (diet control)Hyperthyroidism   Renal/GU negative Renal ROS     Musculoskeletal  (+) Arthritis ,   Abdominal   Peds  Hematology negative hematology ROS (+)   Anesthesia Other Findings   Reproductive/Obstetrics                         Anesthesia Physical Anesthesia Plan  ASA: III  Anesthesia Plan: General   Post-op Pain Management:    Induction: Intravenous  Airway Management Planned: Oral ETT  Additional Equipment:   Intra-op Plan:   Post-operative Plan: Extubation in OR  Informed Consent: I have reviewed the patients History and Physical, chart, labs and discussed the procedure including the risks, benefits and alternatives for the proposed anesthesia with the patient or authorized representative who has indicated his/her understanding and acceptance.   Dental advisory given  Plan Discussed with: CRNA, Anesthesiologist and Surgeon  Anesthesia Plan Comments: (Plan routine  monitors, GETA)       Anesthesia Quick Evaluation

## 2015-11-18 NOTE — Anesthesia Procedure Notes (Addendum)
Procedure Name: Intubation Date/Time: 11/18/2015 10:10 AM Performed by: Garrison Columbus T Pre-anesthesia Checklist: Patient identified, Emergency Drugs available, Suction available and Patient being monitored Patient Re-evaluated:Patient Re-evaluated prior to inductionOxygen Delivery Method: Circle system utilized Preoxygenation: Pre-oxygenation with 100% oxygen Intubation Type: IV induction Ventilation: Mask ventilation without difficulty Laryngoscope Size: Miller and 2 Grade View: Grade I Tube type: Oral Tube size: 7.0 mm Number of attempts: 1 Airway Equipment and Method: LTA kit utilized and Stylet Placement Confirmation: ETT inserted through vocal cords under direct vision,  positive ETCO2 and breath sounds checked- equal and bilateral Secured at: 21 cm Tube secured with: Tape Dental Injury: Teeth and Oropharynx as per pre-operative assessment  Comments: Intubation by Caprice Red, SRNA

## 2015-11-18 NOTE — Interval H&P Note (Signed)
History and Physical Interval Note:  11/18/2015 9:39 AM  [name]  has presented today for surgery, with the diagnosis of hyperthyroidism, thyroid neoplasm of uncertain behavior.  The various methods of treatment have been discussed with the patient and family. After consideration of risks, benefits and other options for treatment, the patient has consented to    Procedure(s): TOTAL THYROIDECTOMY (N/A) as a surgical intervention .    The patient's history has been reviewed, patient examined, no change in status, stable for surgery.  I have reviewed the patient's chart and labs.  Questions were answered to the patient's satisfaction.    Earnstine Regal, MD, [facility] Office: [place]

## 2015-11-18 NOTE — Op Note (Signed)
Procedure Note  Pre-operative Diagnosis:  Thyroid neoplasm of uncertain behavior, hyperthyroidism   Post-operative Diagnosis:  same  Surgeon:  Earnstine Regal, MD, FACS   Procedure:  Total thyroidectomy  Anesthesia:  General  Estimated Blood Loss:  minimal  Drains: none         Specimen: thyroid to pathology  Indications:  Patient is referred by Dr. Jacelyn Pi for consultation and treatment of hyperthyroidism and thyroid neoplasm of uncertain behavior. Patient has had a history of hyperthyroidism dating back 7 day years. She has symptoms of tremor, weight loss, feeling cold, and chronic fatigue. She is currently taking low-dose beta blocker. She has had no prior head or neck surgery but she does have a carotid stent in place. She also has a history of coronary artery disease and underwent coronary artery bypass grafting. She is no longer regularly followed by her cardiologist. There is a family history of thyroid disease in her sister. Details are not known. There is no family history of malignancy. There is no family history of other endocrine neoplasms. Patient underwent nuclear medicine thyroid scan in December 2016. This demonstrated a cold nodule. Patient underwent ultrasound which showed bilateral thyroid nodules. Subsequent fine-needle aspiration biopsy shows the left thyroid nodule to be benign, but the dominant right thyroid nodule had cytologic atypia, Bethesda category III. Patient now presents to discuss thyroidectomy for management of hyperthyroidism and for definitive diagnosis of thyroid neoplasm of uncertain behavior.  Procedure Details: Procedure was done in OR #2 at the [facility].  The patient was brought to the operating room and placed in a supine position on the operating room table.  Following administration of general anesthesia, the patient was positioned and then prepped and draped in the usual aseptic fashion.  After ascertaining that an  adequate level of anesthesia had been achieved, a Kocher incision was made with #15 blade.  Dissection was carried through subcutaneous tissues and platysma. Hemostasis was achieved with the electrocautery.  Skin flaps were elevated cephalad and caudad from the thyroid notch to the sternal notch.  The Mahorner self-retaining retractor was placed for exposure.  Strap muscles were incised in the midline and dissection was begun on the left side.  Strap muscles were reflected laterally.  Left thyroid lobe was mildly enlarged with small nodules.  The left lobe was gently mobilized with blunt dissection.  Superior pole vessels were dissected out and divided individually between small and medium Ligaclips with the Harmonic scalpel.  The thyroid lobe was rolled anteriorly.  Branches of the inferior thyroid artery were divided between small Ligaclips with the Harmonic scalpel.  Inferior venous tributaries were divided between Ligaclips.  Both the superior and inferior parathyroid glands were identified and preserved on their vascular pedicles.  The recurrent laryngeal nerve was identified and preserved along its course.  The ligament of Gwenlyn Found was released with the electrocautery and the gland was mobilized onto the anterior trachea. Isthmus was mobilized across the midline.  There was a moderate sized pyramidal lobe present which was dissected off of the thyroid cartilage and resected.  Dry pack was placed in the left neck.  Next, the right thyroid lobe was gently mobilized with blunt dissection.  Right thyroid lobe was moderately enlarged with a dominant mass in the inferior pole.  Superior pole vessels were dissected out and divided between small and medium Ligaclips with the Harmonic scalpel.  Superior parathyroid was identified and preserved.  Inferior venous tributaries were divided between medium Ligaclips with the  Harmonic scalpel.  The right thyroid lobe was rolled anteriorly and the branches of the inferior  thyroid artery divided between small Ligaclips.  The right recurrent laryngeal nerve was identified and preserved along its course.  The ligament of Gwenlyn Found was released with the electrocautery.  The right thyroid lobe was mobilized onto the anterior trachea and the remainder of the thyroid was dissected off the anterior trachea and the thyroid was completely excised.  A suture was used to mark the right lobe. The entire thyroid gland was submitted to pathology for review.  The neck was irrigated with warm saline.  Fibrillar was placed throughout the operative field.  Strap muscles were reapproximated in the midline with interrupted 3-0 Vicryl sutures.  Platysma was closed with interrupted 3-0 Vicryl sutures.  Skin was closed with a running 4-0 Monocryl subcuticular suture.  Wound was washed and dried and benzoin and steri-strips were applied.  Dry gauze dressing was placed.  The patient was awakened from anesthesia and brought to the recovery room.  The patient tolerated the procedure well.   Earnstine Regal, MD, [facility] Office: [phone]

## 2015-11-19 ENCOUNTER — Encounter (HOSPITAL_COMMUNITY): Payer: Self-pay | Admitting: Surgery

## 2015-11-19 DIAGNOSIS — E0520 Thyrotoxicosis with toxic multinodular goiter without thyrotoxic crisis or storm: Secondary | ICD-10-CM | POA: Diagnosis not present

## 2015-11-19 LAB — BASIC METABOLIC PANEL
Anion gap: 12 (ref 5–15)
CO2: 30 mmol/L (ref 22–32)
CREATININE: 0.74 mg/dL (ref 0.44–1.00)
Calcium: 9.4 mg/dL (ref 8.9–10.3)
Chloride: 98 mmol/L — ABNORMAL LOW (ref 101–111)
GFR calc Af Amer: >60 mL/min (ref >60)
GLUCOSE: 116 mg/dL — AB (ref 65–99)
Potassium: 3.6 mmol/L (ref 3.5–5.1)
Sodium: 140 mmol/L (ref 135–145)

## 2015-11-19 MED ORDER — OXYCODONE HCL 5 MG PO TABS: 5.0000 mg | ORAL_TABLET | ORAL | Status: DC | PRN

## 2015-11-19 MED ORDER — HYDROMORPHONE HCL 2 MG PO TABS: 2.0000 mg | ORAL_TABLET | Freq: Four times a day (QID) | ORAL | Status: DC | PRN

## 2015-11-19 MED ORDER — CALCIUM CARBONATE 1250 (500 CA) MG PO TABS: 2.0000 | ORAL_TABLET | Freq: Two times a day (BID) | ORAL | Status: DC

## 2015-11-19 MED ORDER — PHENOL 1.4 % MT LIQD
1.0000 | OROMUCOSAL | Status: DC | PRN
  Filled 2015-11-19: qty 177

## 2015-11-19 NOTE — Progress Notes (Signed)
Discussed discharge summary with patient. Reviewed all medications with patient. Patient received Rx. Patient ready for discharge. 

## 2015-11-19 NOTE — Discharge Summary (Signed)
Physician Discharge Summary [facility]  Patient ID: [name] MRN: [id number] DOB/AGE: 1950/09/03 65 y.o.  Admit date: 11/18/2015 Discharge date: 11/19/2015  Admission Diagnoses:  Thyroid neoplasm of uncertain behavior, hyperthyroidism  Discharge Diagnoses:  Principal Problem:   Multiple thyroid nodules Active Problems:   Hyperthyroidism   Neoplasm of uncertain behavior of thyroid gland   Discharged Condition: good  Hospital Course: Patient was admitted for observation following thyroid surgery.  Post op course was uncomplicated.  Pain was well controlled.  Tolerated diet.  Post op calcium level on morning following surgery was 9.4 mg/dl.  Patient was prepared for discharge home on POD#1.  Consults: None  Treatments: surgery: total thyroidectomy  Discharge Exam: Blood pressure 140/74, pulse 71, temperature 98.6 F (37 C), temperature source Oral, resp. rate 18, height 5\' 1"  (1.549 m), weight 45.36 kg (100 lb), SpO2 94 %. HEENT - clear Neck - wound dry and intact, mild STS, voice normal Chest - clear bilaterally Cor - RRR  Disposition: Home  Discharge Instructions    Apply dressing    Complete by:  As directed   Apply light gauze dressing to wound before discharge home today.     Diet - low sodium heart healthy    Complete by:  As directed      Diet - low sodium heart healthy    Complete by:  As directed      Discharge instructions    Complete by:  As directed   [facility]  THYROID & PARATHYROID SURGERY:  POST-OP INSTRUCTIONS  Always review your discharge instruction sheet from the facility where your surgery was performed.  A prescription for pain medication may be given to you upon discharge.  Take your pain medication as prescribed.  If narcotic pain medicine is not needed, then you may take acetaminophen (Tylenol) or ibuprofen (Advil) as needed.  Take your usually prescribed medications unless otherwise  directed.  If you need a refill on your pain medication, please contact your pharmacy. They will contact our office to request authorization.  Prescriptions will not be processed by our office after 5 pm or on weekends.  Start with a light diet upon arrival home, such as soup and crackers or toast.  Be sure to drink plenty of fluids daily.  Resume your normal diet the day after surgery.  Most patients will experience some swelling and bruising on the chest and neck area.  Ice packs will help.  Swelling and bruising can take several days to resolve.   It is common to experience some constipation after surgery.  Increasing fluid intake and taking a stool softener will usually help or prevent this problem.  A mild laxative (Milk of Magnesia or Miralax) should be taken according to package directions if there has been no bowel movement after 48 hours.  You have steri-strips and a gauze dressing over your incision.  You may remove the gauze bandage on the second day after surgery, and you may shower at that time.  Leave your steri-strips (small skin tapes) in place directly over the incision.  These strips should remain on the skin for 5-7 days and then be removed.  You may get them wet in the shower and pat them dry.  You may resume regular (light) daily activities beginning the next day - such as daily self-care, walking, climbing stairs - gradually increasing activities as tolerated.  You may have sexual intercourse when it is comfortable.  Refrain from any  heavy lifting or straining until approved by your doctor.  You may drive when you no longer are taking prescription pain medication, you can comfortably wear a seatbelt, and you can safely maneuver your car and apply brakes.  You should see your doctor in the office for a follow-up appointment approximately two to three weeks after your surgery.  Make sure that you call for this appointment within a day or two after you arrive home to insure a  convenient appointment time.  WHEN TO CALL YOUR DOCTOR: -- Fever greater than 101.5 -- Inability to urinate -- Nausea and/or vomiting - persistent -- Extreme swelling or bruising -- Continued bleeding from incision -- Increased pain, redness, or drainage from the incision -- Difficulty swallowing or breathing -- Muscle cramping or spasms -- Numbness or tingling in hands or around lips  The clinic staff is available to answer your questions during regular business hours.  Please don't hesitate to call and ask to speak to one of the nurses if you have concerns.  Earnstine Regal, MD, [facility] Office: [phone]  Website: [email or web address]     Discharge instructions    Complete by:  As directed   [facility]  THYROID & PARATHYROID SURGERY:  POST-OP INSTRUCTIONS  Always review your discharge instruction sheet from the facility where your surgery was performed.  A prescription for pain medication may be given to you upon discharge.  Take your pain medication as prescribed.  If narcotic pain medicine is not needed, then you may take acetaminophen (Tylenol) or ibuprofen (Advil) as needed.  Take your usually prescribed medications unless otherwise directed.  If you need a refill on your pain medication, please contact your pharmacy. They will contact our office to request authorization.  Prescriptions will not be processed by our office after 5 pm or on weekends.  Start with a light diet upon arrival home, such as soup and crackers or toast.  Be sure to drink plenty of fluids daily.  Resume your normal diet the day after surgery.  Most patients will experience some swelling and bruising on the chest and neck area.  Ice packs will help.  Swelling and bruising can take several days to resolve.   It is common to experience some constipation after surgery.  Increasing fluid intake and taking a stool  softener will usually help or prevent this problem.  A mild laxative (Milk of Magnesia or Miralax) should be taken according to package directions if there has been no bowel movement after 48 hours.  You have steri-strips and a gauze dressing over your incision.  You may remove the gauze bandage on the second day after surgery, and you may shower at that time.  Leave your steri-strips (small skin tapes) in place directly over the incision.  These strips should remain on the skin for 5-7 days and then be removed.  You may get them wet in the shower and pat them dry.  You may resume regular (light) daily activities beginning the next day - such as daily self-care, walking, climbing stairs - gradually increasing activities as tolerated.  You may have sexual intercourse when it is comfortable.  Refrain from any heavy lifting or straining until approved by your doctor.  You may drive when you no longer are taking prescription pain medication, you can comfortably wear a seatbelt, and you can safely maneuver your car and apply brakes.  You should see your doctor in the office  for a follow-up appointment approximately two to three weeks after your surgery.  Make sure that you call for this appointment within a day or two after you arrive home to insure a convenient appointment time.  WHEN TO CALL YOUR DOCTOR: -- Fever greater than 101.5 -- Inability to urinate -- Nausea and/or vomiting - persistent -- Extreme swelling or bruising -- Continued bleeding from incision -- Increased pain, redness, or drainage from the incision -- Difficulty swallowing or breathing -- Muscle cramping or spasms -- Numbness or tingling in hands or around lips  The clinic staff is available to answer your questions during regular business hours.  Please don't hesitate to call and ask to speak to one of the nurses if you have concerns.  Earnstine Regal, MD, [facility] Office:  [phone]  Website: [email or web address]     Increase activity slowly    Complete by:  As directed      Increase activity slowly    Complete by:  As directed      Remove dressing in 24 hours    Complete by:  As directed      Remove dressing in 24 hours    Complete by:  As directed             Medication List    TAKE these medications        buPROPion 75 MG tablet  Commonly known as:  WELLBUTRIN  Take 75 mg by mouth 2 (two) times daily.     calcium carbonate 1250 (500 Ca) MG tablet  Commonly known as:  OS-CAL - dosed in mg of elemental calcium  Take 2 tablets (1,000 mg of elemental calcium total) by mouth 2 (two) times daily with a meal.     DULoxetine 60 MG capsule  Commonly known as:  CYMBALTA  Take 60 mg by mouth at bedtime.     gabapentin 600 MG tablet  Commonly known as:  NEURONTIN  Take 600-1,200 mg by mouth at bedtime.     HYDROcodone-acetaminophen 7.5-325 MG tablet  Commonly known as:  NORCO  Take 1 tablet by mouth 3 (three) times daily.     HYDROmorphone 2 MG tablet  Commonly known as:  DILAUDID  Take 1-2 tablets (2-4 mg total) by mouth every 6 (six) hours as needed for moderate pain or severe pain.     metoprolol tartrate 25 MG tablet  Commonly known as:  LOPRESSOR  Take 25 mg by mouth 2 (two) times daily.     nitroGLYCERIN 0.4 MG SL tablet  Commonly known as:  NITROSTAT  Place 0.4 mg under the tongue every 5 (five) minutes as needed for chest pain.     pantoprazole 40 MG tablet  Commonly known as:  PROTONIX  Take 40 mg by mouth 2 (two) times daily.     PROAIR HFA 108 (90 Base) MCG/ACT inhaler  Generic drug:  albuterol  Take 2 puffs by mouth every 8 (eight) hours as needed.     simvastatin 20 MG tablet  Commonly known as:  ZOCOR  Take 20 mg by mouth every evening.     traZODone 150 MG tablet  Commonly known as:  DESYREL  Take 150 mg by mouth at bedtime.     VITAMIN B 12 PO  Take 1 tablet by mouth daily.     VITAMIN D  (CHOLECALCIFEROL) PO  Take 1 tablet by mouth daily.  Follow-up Information    Follow up with Earnstine Regal, MD. Schedule an appointment as soon as possible for a visit in 3 weeks.   Specialty:  General Surgery   Why:  For wound re-check   Contact information:   [place] [address] [phone]       Earnstine Regal, MD, [facility] Office: [phone]   Signed: Earnstine Regal 11/19/2015, 8:02 AM

## 2015-11-20 NOTE — Progress Notes (Signed)
Quick Note:  Please contact patient and notify of benign pathology results.  Claire Wee M. Ingris Pasquarella, MD, [facility] Office: [phone]   ______ 

## 2016-02-29 ENCOUNTER — Emergency Department (HOSPITAL_COMMUNITY): Payer: Medicare Other

## 2016-02-29 ENCOUNTER — Emergency Department (HOSPITAL_COMMUNITY)
Admission: EM | Admit: 2016-02-29 | Discharge: 2016-02-29 | Disposition: A | Discharge status: Home / Self Care | Payer: Medicare Other | Attending: Emergency Medicine | Admitting: Emergency Medicine

## 2016-02-29 ENCOUNTER — Encounter (HOSPITAL_COMMUNITY): Payer: Self-pay | Admitting: Emergency Medicine

## 2016-02-29 DIAGNOSIS — J40 Bronchitis, not specified as acute or chronic: Secondary | ICD-10-CM

## 2016-02-29 DIAGNOSIS — Z951 Presence of aortocoronary bypass graft: Secondary | ICD-10-CM | POA: Insufficient documentation

## 2016-02-29 DIAGNOSIS — I2510 Atherosclerotic heart disease of native coronary artery without angina pectoris: Secondary | ICD-10-CM | POA: Diagnosis not present

## 2016-02-29 DIAGNOSIS — F17210 Nicotine dependence, cigarettes, uncomplicated: Secondary | ICD-10-CM | POA: Insufficient documentation

## 2016-02-29 DIAGNOSIS — Z79899 Other long term (current) drug therapy: Secondary | ICD-10-CM | POA: Insufficient documentation

## 2016-02-29 DIAGNOSIS — E119 Type 2 diabetes mellitus without complications: Secondary | ICD-10-CM | POA: Diagnosis not present

## 2016-02-29 DIAGNOSIS — I252 Old myocardial infarction: Secondary | ICD-10-CM | POA: Insufficient documentation

## 2016-02-29 DIAGNOSIS — R05 Cough: Secondary | ICD-10-CM | POA: Diagnosis present

## 2016-02-29 LAB — BASIC METABOLIC PANEL
Anion gap: 4 — ABNORMAL LOW (ref 5–15)
BUN: 10 mg/dL (ref 6–20)
CALCIUM: 8.7 mg/dL — AB (ref 8.9–10.3)
CO2: 30 mmol/L (ref 22–32)
CREATININE: 1.12 mg/dL — AB (ref 0.44–1.00)
Chloride: 99 mmol/L — ABNORMAL LOW (ref 101–111)
GFR calc Af Amer: 59 mL/min — ABNORMAL LOW (ref >60)
GFR, EST NON AFRICAN AMERICAN: 51 mL/min — AB (ref >60)
GLUCOSE: 103 mg/dL — AB (ref 65–99)
POTASSIUM: 3.9 mmol/L (ref 3.5–5.1)
Sodium: 133 mmol/L — ABNORMAL LOW (ref 135–145)

## 2016-02-29 LAB — D-DIMER, QUANTITATIVE: D-Dimer, Quant: 0.72 ug/mL-FEU — ABNORMAL HIGH (ref 0.00–0.50)

## 2016-02-29 LAB — CBC WITH DIFFERENTIAL/PLATELET
BASOS ABS: 0 10*3/uL (ref 0.0–0.1)
Basophils Relative: 0 %
EOS PCT: 1 %
Eosinophils Absolute: 0.1 10*3/uL (ref 0.0–0.7)
HCT: 33.3 % — ABNORMAL LOW (ref 36.0–46.0)
Hemoglobin: 10.8 g/dL — ABNORMAL LOW (ref 12.0–15.0)
LYMPHS PCT: 15 %
Lymphs Abs: 1.5 10*3/uL (ref 0.7–4.0)
MCH: 28.3 pg (ref 26.0–34.0)
MCHC: 32.4 g/dL (ref 30.0–36.0)
MCV: 87.2 fL (ref 78.0–100.0)
MONO ABS: 1.1 10*3/uL — AB (ref 0.1–1.0)
Monocytes Relative: 11 %
Neutro Abs: 7.1 10*3/uL (ref 1.7–7.7)
Neutrophils Relative %: 73 %
PLATELETS: 195 10*3/uL (ref 150–400)
RBC: 3.82 MIL/uL — ABNORMAL LOW (ref 3.87–5.11)
RDW: 14.6 % (ref 11.5–15.5)
WBC: 9.8 10*3/uL (ref 4.0–10.5)

## 2016-02-29 LAB — TROPONIN I: Troponin I: 0.03 ng/mL (ref <0.03)

## 2016-02-29 MED ORDER — IOPAMIDOL (ISOVUE-370) INJECTION 76%
100.0000 mL | Freq: Once | INTRAVENOUS | Status: AC | PRN
  Administered 2016-02-29: 100 mL via INTRAVENOUS

## 2016-02-29 MED ORDER — SODIUM CHLORIDE 0.9 % IV BOLUS (SEPSIS)
1000.0000 mL | Freq: Once | INTRAVENOUS | Status: AC
  Administered 2016-02-29: 1000 mL via INTRAVENOUS

## 2016-02-29 MED ORDER — DOXYCYCLINE HYCLATE 100 MG PO CAPS: 100.0000 mg | ORAL_CAPSULE | Freq: Two times a day (BID) | ORAL | Status: DC

## 2016-02-29 NOTE — ED Notes (Signed)
Pt ambulated on RA, pt started at 92% and maintained throughout. When pt sat back down in bed, 02 sats were 88% and are now currently at 91%.  Pt reports she felt "wobbly" when she was walking.  Findings reported to [place], [place].

## 2016-02-29 NOTE — ED Provider Notes (Signed)
CSN: [id number]     Arrival date & time 02/29/16  0756 History   First MD Initiated Contact with Patient 02/29/16 0800     Chief Complaint  Patient presents with  . Cough     (Consider location/radiation/quality/duration/timing/severity/associated sxs/prior Treatment) HPI  [name] is a 65 y.o. female with a history of MS, osteoporosis, diabetes, and COPD who presents to the Emergency Department complaining of Cough for 3 days with congestion. She states that the cough is at times productive of yellow to green sputum. She reports an episode of excessive coughing last evening with pain to her right ribs. She states that she frequently has episodes of coughing that causes rib fractures. She complains of pain to the right side that's worse with movement, cough, or deep breathing. She takes hydrocodone for chronic back pain which she states helps with the rib pain. She denies shortness of breath, abdominal pain, hemoptysis. She states that she has been traveling recently but denies being out of the country.   Past Medical History  Diagnosis Date  . Mixed hyperlipidemia   . MI (myocardial infarction) ([place])   . MS (multiple sclerosis) ([place])   . Chronic hoarseness   . GERD (gastroesophageal reflux disease)   . Subclavian vein stenosis, left     Status post stenting - Dr. Burt Knack  . Coronary atherosclerosis of native coronary artery     BMS circumflex, DES LAD, CABG 2012  . Osteoporosis   . Asthma   . Type 2 diabetes mellitus ([place])     'dx long time ago.  with healthy living, she has done well...no meds, not elevated HGB A1c)  . Arthritis   . Neuromuscular disorder ([place])     MS   dx in 1995   Past Surgical History  Procedure Laterality Date  . Coronary artery bypass graft  February 2012    LIMA to LAD, SVG to OM, SVG to RCA and PDA  . Cholecystectomy    . Coronary angioplasty    . Tonsillectomy    . Wrist surgery    . Back surgery    . Fracture surgery      right wrist rod  placed  . Abdominal hysterectomy    . Thyroidectomy N/A 11/18/2015    Procedure: TOTAL THYROIDECTOMY;  Surgeon: Armandina Gemma, MD;  Location: [facility];  Service: General;  Laterality: N/A;   Family History  Problem Relation Age of Onset  . Cancer Father   . Cancer Mother    Social History  Substance Use Topics  . Smoking status: Current Every Day Smoker -- 0.50 packs/day for 40 years    Types: Cigarettes  . Smokeless tobacco: None     Comment: 1/2 pack per day  . Alcohol Use: No   OB History    No data available     Review of Systems  Constitutional: Negative for fever, chills and appetite change.  HENT: Positive for congestion. Negative for sore throat and trouble swallowing.   Respiratory: Positive for cough. Negative for chest tightness, shortness of breath, wheezing and stridor.   Cardiovascular: Negative for chest pain (right rib pain).  Gastrointestinal: Negative for nausea, vomiting and abdominal pain.  Genitourinary: Negative for dysuria.  Musculoskeletal: Negative for joint swelling and arthralgias.  Skin: Negative for rash.  Neurological: Negative for dizziness, weakness and numbness.  Hematological: Negative for adenopathy.  All other systems reviewed and are negative.     Allergies  Morphine sulfate; Zolpidem tartrate; and Sulfa antibiotics  Home Medications   Prior to Admission medications   Medication Sig Start Date End Date Taking? Authorizing Provider  buPROPion (WELLBUTRIN) 75 MG tablet Take 75 mg by mouth 2 (two) times daily. 09/23/15   Historical Provider, MD  calcium carbonate (OS-CAL - DOSED IN MG OF ELEMENTAL CALCIUM) 1250 (500 Ca) MG tablet Take 2 tablets (1,000 mg of elemental calcium total) by mouth 2 (two) times daily with a meal. 11/19/15   Armandina Gemma, MD  Cyanocobalamin (VITAMIN B 12 PO) Take 1 tablet by mouth daily.    Historical Provider, MD  DULoxetine (CYMBALTA) 60 MG capsule Take 60 mg by mouth at bedtime.    Historical Provider, MD   gabapentin (NEURONTIN) 600 MG tablet Take 600-1,200 mg by mouth at bedtime.     Historical Provider, MD  HYDROcodone-acetaminophen (NORCO) 7.5-325 MG tablet Take 1 tablet by mouth 3 (three) times daily. 09/16/15   Historical Provider, MD  HYDROmorphone (DILAUDID) 2 MG tablet Take 1-2 tablets (2-4 mg total) by mouth every 6 (six) hours as needed for moderate pain or severe pain. 11/19/15   Armandina Gemma, MD  metoprolol tartrate (LOPRESSOR) 25 MG tablet Take 25 mg by mouth 2 (two) times daily.    Historical Provider, MD  nitroGLYCERIN (NITROSTAT) 0.4 MG SL tablet Place 0.4 mg under the tongue every 5 (five) minutes as needed for chest pain.     Historical Provider, MD  pantoprazole (PROTONIX) 40 MG tablet Take 40 mg by mouth 2 (two) times daily.    Historical Provider, MD  PROAIR HFA 108 [phone] Base) MCG/ACT inhaler Take 2 puffs by mouth every 8 (eight) hours as needed. 07/30/15   Historical Provider, MD  simvastatin (ZOCOR) 20 MG tablet Take 20 mg by mouth every evening.    Historical Provider, MD  traZODone (DESYREL) 150 MG tablet Take 150 mg by mouth at bedtime.    Historical Provider, MD  VITAMIN D, CHOLECALCIFEROL, PO Take 1 tablet by mouth daily.    Historical Provider, MD   BP 83/57 mmHg  Pulse 84  Temp(Src) 98.8 F (37.1 C) (Oral)  Resp 18  Ht 5\' 2"  (1.575 m)  Wt 43.545 kg  BMI 17.55 kg/m2  SpO2 97% Physical Exam  Constitutional: She is oriented to person, place, and time. She appears well-developed and well-nourished. No distress.  Pt is thin, but not frail  HENT:  Head: Normocephalic and atraumatic.  Right Ear: Tympanic membrane and ear canal normal.  Left Ear: Tympanic membrane and ear canal normal.  Mouth/Throat: Uvula is midline, oropharynx is clear and moist and mucous membranes are normal. No oropharyngeal exudate.  Eyes: EOM are normal. Pupils are equal, round, and reactive to light.  Neck: Normal range of motion, full passive range of motion without pain and phonation normal.  Neck supple.  Cardiovascular: Normal rate, regular rhythm, normal heart sounds and intact distal pulses.   No murmur heard. Pulmonary/Chest: Effort normal. No stridor. No respiratory distress. She has no wheezes. She has no rales. She exhibits no tenderness (right lateral chest wall tenderness to palp.  no crepitus or bruising.).  Abdominal: Soft. She exhibits no distension. There is no tenderness.  Musculoskeletal: She exhibits no edema.  Lymphadenopathy:    She has no cervical adenopathy.  Neurological: She is alert and oriented to person, place, and time. She exhibits normal muscle tone. Coordination normal.  Skin: Skin is warm and dry.  Nursing note and vitals reviewed.   ED Course  Procedures (including critical care time) Labs Review  Labs Reviewed - No data to display  Imaging Review Dg Ribs Unilateral W/chest Right  Result Date: 02/29/2016 CLINICAL DATA:  Headaches since Tuesday and cough since Wednesday. Right rib pain. EXAM: RIGHT RIBS AND CHEST - 3+ VIEW COMPARISON:  Chest x-ray dated 10/17/2015. FINDINGS: Single view of the chest and three views of the right ribs are provided. Median sternotomy wires appear intact and stable in alignment. Heart size is normal. Overall cardiomediastinal silhouette is stable in size and configuration. Lungs are hyperexpanded. Suspect a mild chronic interstitial lung disease and chronic bronchitic changes centrally. Lungs otherwise clear. No pleural effusion or pneumothorax seen. Osseous structures about the chest are unremarkable. No right-sided rib fracture or displacement seen. IMPRESSION: 1. No rib fracture seen. 2. Lungs are hyperexpanded suggesting COPD. Suspect an associated mild chronic interstitial lung disease and chronic bronchitic changes centrally. Lungs otherwise clear. No evidence of pneumonia. Electronically Signed   By: Franki Cabot M.D.   On: 02/29/2016 09:01   Ct Angio Chest Pe W/cm &/or Wo Cm  Result Date: 02/29/2016 CLINICAL  DATA:  65 year old female with right-sided rib pain, cough and headache. EXAM: CT ANGIOGRAPHY CHEST WITH CONTRAST TECHNIQUE: Multidetector CT imaging of the chest was performed using the standard protocol during bolus administration of intravenous contrast. Multiplanar CT image reconstructions and MIPs were obtained to evaluate the vascular anatomy. CONTRAST:  100 mL Isovue 370 COMPARISON:  Chest x-ray and right-sided rib series 7 09/30/2015; prior CT scan of the chest 05/02/2015 FINDINGS: Mediastinum: Surgical changes thyroidectomy. Mildly prominent hilar lymphoid tissue slightly progressed compared to prior imaging. Today, the right hilar lymphoid tissue measures up to 2.2 cm while left-sided lymphoid tissue measures up to 1.7 cm. No definite mediastinal lymphadenopathy. The thoracic esophagus is within normal limits. Heart/Vascular: Patient is status post median sternotomy with evidence of prior multivessel CABG including LIMA bypass. Additionally, there is a metal stent in the proximal left subclavian artery. The stent is patent. No aortic aneurysm or evidence of dissection. Calcifications are present along the course of the coronary arteries. The heart is normal in size. No pericardial effusion. Adequate opacification of the pulmonary arteries to the subsegmental level. No evidence of acute pulmonary embolus. Lungs/Pleura: Diffuse bilateral bronchial wall thickening more prominent on the right than the left. The degree of bronchial wall thickening has certainly progressed compared to prior imaging. Several of the smaller right lower lobe bronchials are occluded with secretions or debris. Advanced centrilobular emphysema. Mild linear atelectasis in the right lower lobe. Bones/Soft Tissues: No acute fracture or aggressive appearing lytic or blastic osseous lesion. 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 Otherwise, the visualized upper abdominal organs are unremarkable. Review of  the MIP images confirms the above findings. IMPRESSION: 1. Negative for acute pulmonary embolus. 2. Compared to 05/02/2015 interval development of diffuse bronchial wall thickening asymmetrically more prominent in the right lower lobe where several small bronchioles are occluded by internal debris or secretions. There is associated subsegmental right lower lobe atelectasis. Findings raise concern for acute bronchitis/bronchiolitis versus viral respiratory infection. 3. Progressive prominence of bilateral (right greater than left) hilar lymphoid tissue compared to prior. This is favored to be reactive and related to the underlying respiratory infection/bronchitis. However, follow-up imaging is warranted to insure stability and exclude the possibility of malignancy. Recommend repeat CT scan of the chest with intravenous contrast in 3 months. 4. Interval surgical changes of thyroidectomy. 5. Coronary artery disease. 6. Advanced centrilobular pulmonary emphysema. 7. Stable 7 mm  splenic artery aneurysm. Electronically Signed   By: Jacqulynn Cadet M.D.   On: 02/29/2016 11:58   I have personally reviewed and evaluated these images and lab results as part of my medical decision-making.   EKG Interpretation  Date/Time:  Saturday February 29 2016 10:13:21 EDT Ventricular Rate:  63 PR Interval:    QRS Duration: 102 QT Interval:  436 QTC Calculation: 447 R Axis:   89 Text Interpretation:  Sinus rhythm Borderline right axis deviation No significant change since last tracing Confirmed by LIU MD, DANA [id number]) on 02/29/2016 10:20:58 AM       MDM   Final diagnoses:  Bronchitis    Pt with tenderenss of the lateral right ribs with hx of fx secondary to coughing.  No tachycardia, tachypnea or hypoxia.  Mildly hypotensive, but she states that is her baseline.  Non-toxic appearing. Drinking water   Pt continues to remain hypotensive.   Pt also seen by Dr. Oleta Mouse and care plan discussed. Will further evaluate, Labs and  IVF's ordered.  Incentive spirometer dispensed   Pt feeling better, BP improved after IVF's.  CT of chest neg for PE.  Pt stable for d/c.  Agrees to f/u with PMD, return precautions given.    Kem Parkinson, PA-C 03/01/16 2143  Forde Dandy, MD 03/02/16 1040

## 2016-02-29 NOTE — ED Notes (Signed)
Troponin 0.03 reported to [place], [place].

## 2016-02-29 NOTE — ED Notes (Signed)
Started with headache since Tues and cough on Wed.  C/o right rib pain, rating pain 8/10.  Has been traveling since June to various states.

## 2016-10-08 ENCOUNTER — Other Ambulatory Visit (HOSPITAL_BASED_OUTPATIENT_CLINIC_OR_DEPARTMENT_OTHER): Payer: Self-pay

## 2016-10-08 DIAGNOSIS — G4733 Obstructive sleep apnea (adult) (pediatric): Secondary | ICD-10-CM

## 2016-10-21 ENCOUNTER — Ambulatory Visit: Payer: Medicare Other | Attending: Neurology | Admitting: Neurology

## 2016-10-21 DIAGNOSIS — R0683 Snoring: Secondary | ICD-10-CM | POA: Diagnosis not present

## 2016-10-21 DIAGNOSIS — Z79899 Other long term (current) drug therapy: Secondary | ICD-10-CM | POA: Diagnosis not present

## 2016-10-21 DIAGNOSIS — G4733 Obstructive sleep apnea (adult) (pediatric): Secondary | ICD-10-CM

## 2016-10-26 NOTE — Procedures (Signed)
See signed noted.

## 2016-10-26 NOTE — Procedures (Signed)
[place] A. Merlene Laughter, MD     [email or web address]             NOCTURNAL POLYSOMNOGRAPHY   LOCATION: [facility]  Patient Name: [name], [name] Date: 10/21/2016 Gender: Female D.O.B: December 01, 1950 Age (years): 68 Referring Provider: Barton Fanny NP Height (inches): 61 Interpreting Physician: Phillips Odor MD, ABSM Weight (lbs): 101 RPSGT: Peak, Robert BMI: 19 MRN: [id number] Neck Size: 13.00 CLINICAL INFORMATION Sleep Study Type: NPSG  Indication for sleep study: N/A  Epworth Sleepiness Score: 7  SLEEP STUDY TECHNIQUE As per the AASM Manual for the Scoring of Sleep and Associated Events v2.3 (April 2016) with a hypopnea requiring 4% desaturations.  The channels recorded and monitored were frontal, central and occipital EEG, electrooculogram (EOG), submentalis EMG (chin), nasal and oral airflow, thoracic and abdominal wall motion, anterior tibialis EMG, snore microphone, electrocardiogram, and pulse oximetry.  MEDICATIONS Medications self-administered by patient taken the night of the study : CYMBALTA, NEURONTIN, TRAZADONE, WELLBUTRIN, METOPROLOL, PROTONIX  Current Outpatient Prescriptions:  .  buPROPion (WELLBUTRIN) 75 MG tablet, Take 75 mg by mouth 2 (two) times daily., Disp: , Rfl:  .  calcium carbonate (OS-CAL - DOSED IN MG OF ELEMENTAL CALCIUM) 1250 (500 Ca) MG tablet, Take 2 tablets (1,000 mg of elemental calcium total) by mouth 2 (two) times daily with a meal., Disp: 60 tablet, Rfl: 1 .  Cyanocobalamin (VITAMIN B 12 PO), Take 1 tablet by mouth daily., Disp: , Rfl:  .  doxycycline (VIBRAMYCIN) 100 MG capsule, Take 1 capsule (100 mg total) by mouth 2 (two) times daily., Disp: 20 capsule, Rfl: 0 .  DULoxetine (CYMBALTA) 60 MG capsule, Take 60 mg by mouth at bedtime., Disp: , Rfl:  .  gabapentin (NEURONTIN) 600 MG tablet, Take 600-1,200 mg by mouth at bedtime. , Disp: , Rfl:  .  HYDROcodone-acetaminophen (NORCO) 7.5-325 MG tablet, Take 1  tablet by mouth 3 (three) times daily., Disp: , Rfl:  .  levothyroxine (SYNTHROID, LEVOTHROID) 112 MCG tablet, Take 112 mcg by mouth daily before breakfast., Disp: , Rfl:  .  metoprolol tartrate (LOPRESSOR) 25 MG tablet, Take 25 mg by mouth 2 (two) times daily., Disp: , Rfl:  .  nitroGLYCERIN (NITROSTAT) 0.4 MG SL tablet, Place 0.4 mg under the tongue every 5 (five) minutes as needed for chest pain. , Disp: , Rfl:  .  pantoprazole (PROTONIX) 40 MG tablet, Take 40 mg by mouth 2 (two) times daily., Disp: , Rfl:  .  PROAIR HFA 108 (90 Base) MCG/ACT inhaler, Take 2 puffs by mouth every 8 (eight) hours as needed., Disp: , Rfl:  .  simvastatin (ZOCOR) 20 MG tablet, Take 20 mg by mouth every evening., Disp: , Rfl:  .  traZODone (DESYREL) 150 MG tablet, Take 150 mg by mouth at bedtime., Disp: , Rfl:  .  VITAMIN D, CHOLECALCIFEROL, PO, Take 1 tablet by mouth daily., Disp: , Rfl:   SLEEP ARCHITECTURE The study was initiated at 10:06:39 PM and ended at 4:42:19 AM.  Sleep onset time was 19.7 minutes and the sleep efficiency was 83.1%. The total sleep time was 329.0 minutes.  Stage REM latency was 331.5 minutes.  The patient spent 6.08% of the night in stage N1 sleep, 84.95% in stage N2 sleep, 0.00% in stage N3 and 8.97% in REM.  Alpha intrusion was absent.  Supine sleep was 2.13%.  RESPIRATORY PARAMETERS The overall apnea/hypopnea index (AHI) was 1.1 per hour. There were 2 total apneas, including 2 obstructive, 0 central and 0 mixed  apneas. There were 4 hypopneas and 36 RERAs.  The AHI during Stage REM sleep was 0.0 per hour.  AHI while supine was 42.8 per hour.  The mean oxygen saturation was 90.95%. The minimum SpO2 during sleep was 87.00%.  Moderate snoring was noted during this study.  CARDIAC DATA The 2 lead EKG demonstrated sinus rhythm. The mean heart rate was 59.21 beats per minute. Other EKG findings include: None. LEG MOVEMENT DATA The total PLMS were 0 with a resulting PLMS index  of 0.00. Associated arousal with leg movement index was 0.0.  IMPRESSIONS - No significant obstructive sleep apnea occurred during this study. - No significant central sleep apnea occurred during this study. - Clinically significant periodic limb movements did not occur during sleep. No significant associated arousals.   Delano Metz, MD Diplomate, American Board of Sleep Medicine.

## 2016-11-24 ENCOUNTER — Encounter: Payer: Self-pay | Admitting: Internal Medicine

## 2016-12-16 ENCOUNTER — Ambulatory Visit: Payer: Medicare Other | Admitting: Gastroenterology

## 2016-12-31 ENCOUNTER — Encounter: Payer: Self-pay | Admitting: Gastroenterology

## 2017-01-01 IMAGING — NM NM MYOCAR MULTI W/SPECT W/WALL MOTION & EF
2 series · 12 of 12 positions shown · non-contrast
Comparison: none

[Series 1: rest · 8.28mm/px · 6 of 64 frames shown]
[frame 6/64]
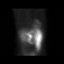
[frame 16/64]
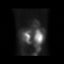
[frame 27/64]
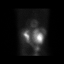
[frame 38/64]
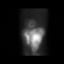
[frame 48/64]
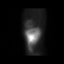
[frame 59/64]
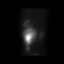

[Series 2: stress gated · 8.28mm/px · 6 of 64 frames shown]
[frame 6/64]
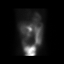
[frame 16/64]
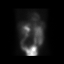
[frame 27/64]
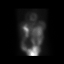
[frame 38/64]
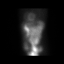
[frame 48/64]
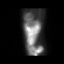
[frame 59/64]
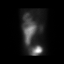

[12 of 12 positions shown; findings below may reference images not displayed]

Canned report from images found in remote index.

Refer to host system for actual result text.

## 2017-01-12 ENCOUNTER — Ambulatory Visit: Payer: Medicare Other | Admitting: Gastroenterology

## 2017-02-01 ENCOUNTER — Ambulatory Visit (INDEPENDENT_AMBULATORY_CARE_PROVIDER_SITE_OTHER): Payer: Medicare Other | Admitting: Gastroenterology

## 2017-02-01 ENCOUNTER — Encounter: Payer: Self-pay | Admitting: Gastroenterology

## 2017-02-01 DIAGNOSIS — Z1211 Encounter for screening for malignant neoplasm of colon: Secondary | ICD-10-CM | POA: Diagnosis not present

## 2017-02-01 DIAGNOSIS — K5900 Constipation, unspecified: Secondary | ICD-10-CM | POA: Insufficient documentation

## 2017-02-01 DIAGNOSIS — R1319 Other dysphagia: Secondary | ICD-10-CM

## 2017-02-01 DIAGNOSIS — R1013 Epigastric pain: Secondary | ICD-10-CM | POA: Diagnosis not present

## 2017-02-01 DIAGNOSIS — R1310 Dysphagia, unspecified: Secondary | ICD-10-CM | POA: Diagnosis not present

## 2017-02-01 DIAGNOSIS — K219 Gastro-esophageal reflux disease without esophagitis: Secondary | ICD-10-CM | POA: Diagnosis not present

## 2017-02-01 MED ORDER — LUBIPROSTONE 8 MCG PO CAPS: 8.0000 ug | ORAL_CAPSULE | Freq: Two times a day (BID) | ORAL | 3 refills | Status: AC

## 2017-02-01 NOTE — Assessment & Plan Note (Signed)
Chronic constipation, per patient preceding chronic daily narcotics. Uncontrolled at this time. Did not tolerate Linzess previously. Begin a Amitiza 8 g twice a day with food. She will let me know if her stools are not more regular especially preceding upcoming colonoscopy.

## 2017-02-01 NOTE — Assessment & Plan Note (Addendum)
66 year old female with chronic epigastric pain for more than a year. Seems to be unrelated to meals. Possibly multifactorial including musculoskeletal component. She has uncontrolled GERD, dysphagia therefore would advise an upper endoscopy for further evaluation, possible esophageal dilation. Plan for deep sedation were given chronic narcotics.  I have discussed the risks, alternatives, benefits with regards to but not limited to the risk of reaction to medication, bleeding, infection, perforation and the patient is agreeable to proceed. Written consent to be obtained.  Obtain copy of recent labs and CT for review.

## 2017-02-01 NOTE — Patient Instructions (Signed)
1. Start Amitiza 8 g twice daily with food for constipation. Samples provided. Prescription sent to your pharmacy. 2. I will review copy of recent labs and CT. We will let you know if you need any additional labs. 3. Colonoscopy and upper endoscopy as scheduled. Please see separate instructions. 4. If your bowels do not become more regulated on an Amitiza, please let me know. It is important that we get her bowels moving more regularly prior to your colonoscopy so that we can ensure that you have an adequate bowel prep for your exam.

## 2017-02-01 NOTE — Progress Notes (Signed)
CC'D TO PCP °

## 2017-02-01 NOTE — Progress Notes (Signed)
Primary Care Physician: Monico Blitz, MD  Primary Gastroenterologist:  Garfield Cornea, MD   Chief Complaint  Patient presents with  . Abdominal Pain  . Constipation    HPI: [name] is a 66 y.o. female hereAt the request of PCP for further evaluation of abdominal pain. Previously seen by Dr. Gala Romney remotely, EGD in 2008, normal. She believes she had a normal colonoscopy in 2007 according to her PCP. She cannot recall where that was done, possibly at [place] find records.  Constant epigastric pain. Worse with pressure, touching it. Hurts to wear a bra. Does not worsen with meals. Nothing seems to make it better except for maybe changing positions at times. Mild to moderate in severity. She has no vomiting. Sometimes nausea. A lot of heartburn despite Protonix twice a day for years. BM once per week. No brbpr. Some dark stool. Takes MiraLAX at times. Takes pain medication 3 times daily. States she has constipation way before pain medications were started. Also complains of dysphagia to solid foods. Alka-Seltzer tablet current stuck yesterday. She's had some issues with weight loss since her thyroidectomy last year. Having difficulty regulating her medication. She weighed 100 pounds back in April 2017, down to 96 pounds in July 2017 and has been stable since.   She has tried Linzess previously caused bloating into much diarrhea. She believes she was on the 72 g dose.   Current Outpatient Prescriptions  Medication Sig Dispense Refill  . buPROPion (WELLBUTRIN) 75 MG tablet Take 75 mg by mouth 2 (two) times daily.    . Cyanocobalamin (VITAMIN B 12 PO) Take 1 tablet by mouth daily.    Marland Kitchen docusate sodium (COLACE) 100 MG capsule Take 100 mg by mouth 2 (two) times daily.    . DULoxetine (CYMBALTA) 60 MG capsule Take 60 mg by mouth at bedtime.    . gabapentin (NEURONTIN) 600 MG tablet Take 600-1,200 mg by mouth at bedtime.     Marland Kitchen HYDROcodone-acetaminophen  (NORCO) 7.5-325 MG tablet Take 1 tablet by mouth 3 (three) times daily.    Marland Kitchen levothyroxine (SYNTHROID, LEVOTHROID) 112 MCG tablet Take 112 mcg by mouth daily before breakfast.    . metoprolol tartrate (LOPRESSOR) 25 MG tablet Take 25 mg by mouth 2 (two) times daily.    . nitroGLYCERIN (NITROSTAT) 0.4 MG SL tablet Place 0.4 mg under the tongue every 5 (five) minutes as needed for chest pain.     . pantoprazole (PROTONIX) 40 MG tablet Take 40 mg by mouth 2 (two) times daily.    Marland Kitchen PROAIR HFA 108 (90 Base) MCG/ACT inhaler Take 2 puffs by mouth every 8 (eight) hours as needed.    . simvastatin (ZOCOR) 20 MG tablet Take 20 mg by mouth every evening.    . traZODone (DESYREL) 150 MG tablet Take 150 mg by mouth at bedtime.    Marland Kitchen VITAMIN D, CHOLECALCIFEROL, PO Take 1 tablet by mouth daily.    . SYMBICORT 160-4.5 MCG/ACT inhaler      No current facility-administered medications for this visit.     Allergies as of 02/01/2017 - Review Complete 02/01/2017  Allergen Reaction Noted  . Morphine sulfate Other (See Comments) 01/17/2008  . Zolpidem tartrate Other (See Comments) 01/17/2008  . Sulfa antibiotics Nausea And Vomiting 11/24/2012   Past Medical History:  Diagnosis Date  . Arthritis   . Asthma   . Chronic hoarseness   . COPD (chronic obstructive pulmonary disease) ([place])   .  Coronary atherosclerosis of native coronary artery    BMS circumflex, DES LAD, CABG 2012  . GERD (gastroesophageal reflux disease)   . MI (myocardial infarction) ([place])   . Mixed hyperlipidemia   . MS (multiple sclerosis) ([place])   . Neuromuscular disorder ([place])    MS   dx in 1995  . Osteoporosis   . Subclavian vein stenosis, left    Status post stenting - Dr. Burt Knack  . Type 2 diabetes mellitus ([place])    'dx long time ago.  with healthy living, she has done well...no meds, not elevated HGB A1c)   Past Surgical History:  Procedure Laterality Date  . ABDOMINAL HYSTERECTOMY    . BACK SURGERY     2 rods  . CHOLECYSTECTOMY     . COLONOSCOPY  2007   Normal per PCP  . CORONARY ANGIOPLASTY    . CORONARY ARTERY BYPASS GRAFT  February 2012   LIMA to LAD, SVG to OM, SVG to RCA and PDA  . ESOPHAGOGASTRODUODENOSCOPY  2008   Dr. Gala Romney: normal  . FRACTURE SURGERY     right wrist rod placed  . THYROIDECTOMY N/A 11/18/2015   Procedure: TOTAL THYROIDECTOMY;  Surgeon: Armandina Gemma, MD;  Location: [facility];  Service: General;  Laterality: N/A;  . TONSILLECTOMY    . WRIST SURGERY     Family History  Problem Relation Age of Onset  . Pancreatic cancer Mother   . Prostate cancer Father   . Colon cancer Neg Hx    Social History   Social History  . Marital status: Married    Spouse name: N/A  . Number of children: N/A  . Years of education: N/A   Social History Main Topics  . Smoking status: Former Smoker    Packs/day: 0.50    Years: 40.00    Types: Cigarettes    Quit date: 07/06/2016  . Smokeless tobacco: Never Used     Comment: 1/2 pack per day  . Alcohol use No  . Drug use: No  . Sexual activity: Not Asked   Other Topics Concern  . None   Social History Narrative  . None    ROS:  General: Negative for anorexia, weight loss, fever, chills, fatigue, weakness. ENT: Negative for hoarseness,  nasal congestion. See history of present illness CV: Negative for chest pain, angina, palpitations, dyspnea on exertion, peripheral edema.  Respiratory: Negative for dyspnea at rest, dyspnea on exertion, cough, sputum, wheezing.  GI: See history of present illness. GU:  Negative for dysuria, hematuria, urinary incontinence, urinary frequency, nocturnal urination.  Endo: Negative for unusual weight change.    Physical Examination:   BP 134/72   Pulse 60   Temp 98.3 F (36.8 C) (Oral)   Ht 5\' 1"  (1.549 m)   Wt 95 lb 3.2 oz (43.2 kg)   BMI 17.99 kg/m   General: Well-nourished, well-developed in no acute distress. Thin. Accompanied by husband. Somewhat tanned unusual appearance in color. Eyes: No  icterus. Mouth: Oropharyngeal mucosa moist and pink , no lesions erythema or exudate. Lungs: Clear to auscultation bilaterally.  Heart: Regular rate and rhythm, no murmurs rubs or gallops.  Abdomen: Bowel sounds are normal, mild epigastric tenderness, nondistended, no hepatosplenomegaly or masses, no abdominal bruits or hernia , no rebound or guarding.   Extremities: No lower extremity edema. No clubbing or deformities. Neuro: Alert and oriented x 4   Skin: Warm and dry, no jaundice.   Psych: Alert and cooperative, normal mood and affect.  Labs:  Lab Results  Component Value Date   CREATININE 1.12 (H) 02/29/2016   BUN 10 02/29/2016   NA 133 (L) 02/29/2016   K 3.9 02/29/2016   CL 99 (L) 02/29/2016   CO2 30 02/29/2016    Lab Results  Component Value Date   WBC 9.8 02/29/2016   HGB 10.8 (L) 02/29/2016   HCT 33.3 (L) 02/29/2016   MCV 87.2 02/29/2016   PLT 195 02/29/2016   No results found for: IRON, TIBC, FERRITIN  Imaging Studies: No results found.

## 2017-02-01 NOTE — Assessment & Plan Note (Signed)
Due for screening colonoscopy. Plan for deep sedation in the OR given chronic narcotics.  I have discussed the risks, alternatives, benefits with regards to but not limited to the risk of reaction to medication, bleeding, infection, perforation and the patient is agreeable to proceed. Written consent to be obtained.

## 2017-02-02 ENCOUNTER — Telehealth: Payer: Self-pay

## 2017-02-02 ENCOUNTER — Other Ambulatory Visit: Payer: Self-pay

## 2017-02-02 DIAGNOSIS — K219 Gastro-esophageal reflux disease without esophagitis: Secondary | ICD-10-CM

## 2017-02-02 DIAGNOSIS — R1013 Epigastric pain: Secondary | ICD-10-CM

## 2017-02-02 DIAGNOSIS — R1310 Dysphagia, unspecified: Secondary | ICD-10-CM

## 2017-02-02 DIAGNOSIS — K5900 Constipation, unspecified: Secondary | ICD-10-CM

## 2017-02-02 MED ORDER — PEG 3350-KCL-NA BICARB-NACL 420 G PO SOLR: 4000.0000 mL | ORAL | 0 refills | Status: AC

## 2017-02-02 NOTE — Telephone Encounter (Signed)
Pt called office. TCS/EGD/ED with Propofol with RMR scheduled for 03/22/17 at 8:45am. Rx for prep sent to [facility]. Orders entered for procedure. Pre-op appt 03/16/17 at 10:00am (called pt back and informed her of pre-op). Instructions and pre-op letter mailed to pt.  PA info for TCS/EGD/ED submitted via [facility] website. No PA needed. Decision ID# [id number].

## 2017-02-02 NOTE — Progress Notes (Signed)
Received copy of CT abdomen pelvis report dated 11/26/2016. Performed with contrast. No acute findings noted, nothing to explain abdominal pain. Report to be scanned.

## 2017-02-02 NOTE — Telephone Encounter (Signed)
Tried to call pt to schedule TCS/EGD/ED in OR with RMR, no answer, LMOVM for her to call office.

## 2017-02-14 NOTE — Progress Notes (Signed)
REVIEWED-NO ADDITIONAL RECOMMENDATIONS. 

## 2017-03-11 NOTE — Patient Instructions (Signed)
[name]  03/11/2017     @PREFPERIOPPHARMACY @   Your procedure is scheduled on 03/22/2017.  Report to [facility] at 7:15 A.M.  Call this number if you have problems the morning of surgery:  [phone]   Remember:  Do not eat food or drink liquids after midnight.  Take these medicines the morning of surgery with A SIP OF WATER Symbicort, Pro Air and bring with you, Protonix, Metoprolol, Synthroid, Hydrocodone if needed, Gabapentin, Cymbalta, Wellbutrin   Do not wear jewelry, make-up or nail polish.  Do not wear lotions, powders, or perfumes, or deoderant.  Do not shave 48 hours prior to surgery.  Men may shave face and neck.  Do not bring valuables to the hospital.  [facility] is not responsible for any belongings or valuables.  Contacts, dentures or bridgework may not be worn into surgery.  Leave your suitcase in the car.  After surgery it may be brought to your room.  For patients admitted to the hospital, discharge time will be determined by your treatment team.  Patients discharged the day of surgery will not be allowed to drive home.    Please read over the following fact sheets that you were given. Anesthesia Post-op Instructions     PATIENT INSTRUCTIONS POST-ANESTHESIA  IMMEDIATELY FOLLOWING SURGERY:  Do not drive or operate machinery for the first twenty four hours after surgery.  Do not make any important decisions for twenty four hours after surgery or while taking narcotic pain medications or sedatives.  If you develop intractable nausea and vomiting or a severe headache please notify your doctor immediately.  FOLLOW-UP:  Please make an appointment with your surgeon as instructed. You do not need to follow up with anesthesia unless specifically instructed to do so.  WOUND CARE INSTRUCTIONS (if applicable):  Keep a dry clean dressing on the anesthesia/puncture wound site if there is drainage.  Once the wound has quit draining you may leave it open to air.   Generally you should leave the bandage intact for twenty four hours unless there is drainage.  If the epidural site drains for more than 36-48 hours please call the anesthesia department.  QUESTIONS?:  Please feel free to call your physician or the hospital operator if you have any questions, and they will be happy to assist you.      Esophagogastroduodenoscopy Esophagogastroduodenoscopy (EGD) is a procedure to examine the lining of the esophagus, stomach, and first part of the small intestine (duodenum). This procedure is done to check for problems such as inflammation, bleeding, ulcers, or growths. During this procedure, a long, flexible, lighted tube with a camera attached (endoscope) is inserted down the throat. Tell a health care provider about:  Any allergies you have.  All medicines you are taking, including vitamins, herbs, eye drops, creams, and over-the-counter medicines.  Any problems you or family members have had with anesthetic medicines.  Any blood disorders you have.  Any surgeries you have had.  Any medical conditions you have.  Whether you are pregnant or may be pregnant. What are the risks? Generally, this is a safe procedure. However, problems may occur, including:  Infection.  Bleeding.  A tear (perforation) in the esophagus, stomach, or duodenum.  Trouble breathing.  Excessive sweating.  Spasms of the larynx.  A slowed heartbeat.  Low blood pressure.  What happens before the procedure?  Follow instructions from your health care provider about eating or drinking restrictions.  Ask your health care provider about: ?  Changing or stopping your regular medicines. This is especially important if you are taking diabetes medicines or blood thinners. ? Taking medicines such as aspirin and ibuprofen. These medicines can thin your blood. Do not take these medicines before your procedure if your health care provider instructs you not to.  Plan to have  someone take you home after the procedure.  If you wear dentures, be ready to remove them before the procedure. What happens during the procedure?  To reduce your risk of infection, your health care team will wash or sanitize their hands.  An IV tube will be put in a vein in your hand or arm. You will get medicines and fluids through this tube.  You will be given one or more of the following: ? A medicine to help you relax (sedative). ? A medicine to numb the area (local anesthetic). This medicine may be sprayed into your throat. It will make you feel more comfortable and keep you from gagging or coughing during the procedure. ? A medicine for pain.  A mouth guard may be placed in your mouth to protect your teeth and to keep you from biting on the endoscope.  You will be asked to lie on your left side.  The endoscope will be lowered down your throat into your esophagus, stomach, and duodenum.  Air will be put into the endoscope. This will help your health care provider see better.  The lining of your esophagus, stomach, and duodenum will be examined.  Your health care provider may: ? Take a tissue sample so it can be looked at in a lab (biopsy). ? Remove growths. ? Remove objects (foreign bodies) that are stuck. ? Treat any bleeding with medicines or other devices that stop tissue from bleeding. ? Widen (dilate) or stretch narrowed areas of your esophagus and stomach.  The endoscope will be taken out. The procedure may vary among health care providers and hospitals. What happens after the procedure?  Your blood pressure, heart rate, breathing rate, and blood oxygen level will be monitored often until the medicines you were given have worn off.  Do not eat or drink anything until the numbing medicine has worn off and your gag reflex has returned. This information is not intended to replace advice given to you by your health care provider. Make sure you discuss any questions you  have with your health care provider. Document Released: 11/27/2004 Document Revised: 01/02/2016 Document Reviewed: 06/20/2015 Elsevier Interactive Patient Education  2018 [employer]. Esophageal Dilatation Esophageal dilatation is a procedure to open a blocked or narrowed part of the esophagus. The esophagus is the long tube in your throat that carries food and liquid from your mouth to your stomach. The procedure is also called esophageal dilation. You may need this procedure if you have a buildup of scar tissue in your esophagus that makes it difficult, painful, or even impossible to swallow. This can be caused by gastroesophageal reflux disease (GERD). In rare cases, people need this procedure because they have cancer of the esophagus or a problem with the way food moves through the esophagus. Sometimes you may need to have another dilatation to enlarge the opening of the esophagus gradually. Tell a health care provider about:  Any allergies you have.  All medicines you are taking, including vitamins, herbs, eye drops, creams, and over-the-counter medicines.  Any problems you or family members have had with anesthetic medicines.  Any blood disorders you have.  Any surgeries you have had.  Any medical conditions you have.  Any antibiotic medicines you are required to take before dental procedures. What are the risks? Generally, this is a safe procedure. However, problems can occur and include:  Bleeding from a tear in the lining of the esophagus.  A hole (perforation) in the esophagus.  What happens before the procedure?  Do not eat or drink anything after midnight on the night before the procedure or as directed by your health care provider.  Ask your health care provider about changing or stopping your regular medicines. This is especially important if you are taking diabetes medicines or blood thinners.  Plan to have someone take you home after the procedure. What happens  during the procedure?  You will be given a medicine that makes you relaxed and sleepy (sedative).  A medicine may be sprayed or gargled to numb the back of the throat.  Your health care provider can use various instruments to do an esophageal dilatation. During the procedure, the instrument used will be placed in your mouth and passed down into your esophagus. Options include: ? Simple dilators. This instrument is carefully placed in the esophagus to stretch it. ? Guided wire bougies. In this method, a flexible tube (endoscope) is used to insert a wire into the esophagus. The dilator is passed over this wire to enlarge the esophagus. Then the wire is removed. ? Balloon dilators. An endoscope with a small balloon at the end is passed down into the esophagus. Inflating the balloon gently stretches the esophagus and opens it up. What happens after the procedure?  Your blood pressure, heart rate, breathing rate, and blood oxygen level will be monitored often until the medicines you were given have worn off.  Your throat may feel slightly sore and will probably still feel numb. This will improve slowly over time.  You will not be allowed to eat or drink until the throat numbness has resolved.  If this is a same-day procedure, you may be allowed to go home once you have been able to drink, urinate, and sit on the edge of the bed without nausea or dizziness.  If this is a same-day procedure, you should have a friend or family member with you for the next 24 hours after the procedure. This information is not intended to replace advice given to you by your health care provider. Make sure you discuss any questions you have with your health care provider. Document Released: 09/17/2005 Document Revised: 01/02/2016 Document Reviewed: 12/06/2013 Elsevier Interactive Patient Education  2017 [place]. Colonoscopy, Adult A colonoscopy is an exam to look at the entire large intestine. During the exam, a  lubricated, bendable tube is inserted into the anus and then passed into the rectum, colon, and other parts of the large intestine. A colonoscopy is often done as a part of normal colorectal screening or in response to certain symptoms, such as anemia, persistent diarrhea, abdominal pain, and blood in the stool. The exam can help screen for and diagnose medical problems, including:  Tumors.  Polyps.  Inflammation.  Areas of bleeding.  Tell a health care provider about:  Any allergies you have.  All medicines you are taking, including vitamins, herbs, eye drops, creams, and over-the-counter medicines.  Any problems you or family members have had with anesthetic medicines.  Any blood disorders you have.  Any surgeries you have had.  Any medical conditions you have.  Any problems you have had passing stool. What are the risks? Generally, this is a  safe procedure. However, problems may occur, including:  Bleeding.  A tear in the intestine.  A reaction to medicines given during the exam.  Infection (rare).  What happens before the procedure? Eating and drinking restrictions Follow instructions from your health care provider about eating and drinking, which may include:  A few days before the procedure - follow a low-fiber diet. Avoid nuts, seeds, dried fruit, raw fruits, and vegetables.  1-3 days before the procedure - follow a clear liquid diet. Drink only clear liquids, such as clear broth or bouillon, black coffee or tea, clear juice, clear soft drinks or sports drinks, gelatin dessert, and popsicles. Avoid any liquids that contain red or purple dye.  On the day of the procedure - do not eat or drink anything during the 2 hours before the procedure, or within the time period that your health care provider recommends.  Bowel prep If you were prescribed an oral bowel prep to clean out your colon:  Take it as told by your health care provider. Starting the day before your  procedure, you will need to drink a large amount of medicated liquid. The liquid will cause you to have multiple loose stools until your stool is almost clear or light green.  If your skin or anus gets irritated from diarrhea, you may use these to relieve the irritation: ? Medicated wipes, such as adult wet wipes with aloe and vitamin E. ? A skin soothing-product like petroleum jelly.  If you vomit while drinking the bowel prep, take a break for up to 60 minutes and then begin the bowel prep again. If vomiting continues and you cannot take the bowel prep without vomiting, call your health care provider.  General instructions  Ask your health care provider about changing or stopping your regular medicines. This is especially important if you are taking diabetes medicines or blood thinners.  Plan to have someone take you home from the hospital or clinic. What happens during the procedure?  An IV tube may be inserted into one of your veins.  You will be given medicine to help you relax (sedative).  To reduce your risk of infection: ? Your health care team will wash or sanitize their hands. ? Your anal area will be washed with soap.  You will be asked to lie on your side with your knees bent.  Your health care provider will lubricate a long, thin, flexible tube. The tube will have a camera and a light on the end.  The tube will be inserted into your anus.  The tube will be gently eased through your rectum and colon.  Air will be delivered into your colon to keep it open. You may feel some pressure or cramping.  The camera will be used to take images during the procedure.  A small tissue sample may be removed from your body to be examined under a microscope (biopsy). If any potential problems are found, the tissue will be sent to a lab for testing.  If small polyps are found, your health care provider may remove them and have them checked for cancer cells.  The tube that was  inserted into your anus will be slowly removed. The procedure may vary among health care providers and hospitals. What happens after the procedure?  Your blood pressure, heart rate, breathing rate, and blood oxygen level will be monitored until the medicines you were given have worn off.  Do not drive for 24 hours after the exam.  You may  have a small amount of blood in your stool.  You may pass gas and have mild abdominal cramping or bloating due to the air that was used to inflate your colon during the exam.  It is up to you to get the results of your procedure. Ask your health care provider, or the department performing the procedure, when your results will be ready. This information is not intended to replace advice given to you by your health care provider. Make sure you discuss any questions you have with your health care provider. Document Released: 07/24/2000 Document Revised: 05/27/2016 Document Reviewed: 10/08/2015 Elsevier Interactive Patient Education  2018 [employer].

## 2017-03-16 ENCOUNTER — Encounter (HOSPITAL_COMMUNITY): Payer: Self-pay

## 2017-03-16 ENCOUNTER — Other Ambulatory Visit: Payer: Self-pay

## 2017-03-16 ENCOUNTER — Encounter (HOSPITAL_COMMUNITY)
Admission: RE | Admit: 2017-03-16 | Discharge: 2017-03-16 | Disposition: A | Discharge status: Home / Self Care | Payer: Medicare Other | Source: Ambulatory Visit | Attending: Internal Medicine | Admitting: Internal Medicine

## 2017-03-16 DIAGNOSIS — K219 Gastro-esophageal reflux disease without esophagitis: Secondary | ICD-10-CM | POA: Diagnosis not present

## 2017-03-16 DIAGNOSIS — Z01812 Encounter for preprocedural laboratory examination: Secondary | ICD-10-CM | POA: Insufficient documentation

## 2017-03-16 DIAGNOSIS — Z01818 Encounter for other preprocedural examination: Secondary | ICD-10-CM | POA: Insufficient documentation

## 2017-03-16 DIAGNOSIS — K5900 Constipation, unspecified: Secondary | ICD-10-CM | POA: Diagnosis not present

## 2017-03-16 DIAGNOSIS — R1310 Dysphagia, unspecified: Secondary | ICD-10-CM | POA: Diagnosis not present

## 2017-03-16 DIAGNOSIS — R1013 Epigastric pain: Secondary | ICD-10-CM | POA: Diagnosis not present

## 2017-03-16 LAB — CBC WITH DIFFERENTIAL/PLATELET
Basophils Absolute: 0 10*3/uL (ref 0.0–0.1)
Basophils Relative: 0 %
EOS ABS: 0.2 10*3/uL (ref 0.0–0.7)
Eosinophils Relative: 3 %
HEMATOCRIT: 37.7 % (ref 36.0–46.0)
Hemoglobin: 12.4 g/dL (ref 12.0–15.0)
LYMPHS ABS: 1.6 10*3/uL (ref 0.7–4.0)
Lymphocytes Relative: 26 %
MCH: 28.1 pg (ref 26.0–34.0)
MCHC: 32.9 g/dL (ref 30.0–36.0)
MCV: 85.5 fL (ref 78.0–100.0)
MONO ABS: 0.4 10*3/uL (ref 0.1–1.0)
MONOS PCT: 7 %
NEUTROS ABS: 3.9 10*3/uL (ref 1.7–7.7)
NEUTROS PCT: 64 %
Platelets: 228 10*3/uL (ref 150–400)
RBC: 4.41 MIL/uL (ref 3.87–5.11)
RDW: 14.2 % (ref 11.5–15.5)
WBC: 6.1 10*3/uL (ref 4.0–10.5)

## 2017-03-16 LAB — BASIC METABOLIC PANEL
Anion gap: 6 (ref 5–15)
BUN: 11 mg/dL (ref 6–20)
CO2: 30 mmol/L (ref 22–32)
CREATININE: 0.89 mg/dL (ref 0.44–1.00)
Calcium: 9.3 mg/dL (ref 8.9–10.3)
Chloride: 102 mmol/L (ref 101–111)
GFR calc Af Amer: >60 mL/min (ref >60)
GFR calc non Af Amer: >60 mL/min (ref >60)
GLUCOSE: 102 mg/dL — AB (ref 65–99)
Potassium: 4.5 mmol/L (ref 3.5–5.1)
Sodium: 138 mmol/L (ref 135–145)

## 2017-03-16 NOTE — Pre-Procedure Instructions (Signed)
Prep instructions given to patient, as she is unsure if she has copy from office.  Details discussed.

## 2017-03-22 ENCOUNTER — Encounter (HOSPITAL_COMMUNITY)
Admission: RE | Disposition: A | Discharge status: Home / Self Care | Payer: Self-pay | Source: Ambulatory Visit | Attending: Internal Medicine

## 2017-03-22 ENCOUNTER — Encounter (HOSPITAL_COMMUNITY): Payer: Self-pay

## 2017-03-22 ENCOUNTER — Ambulatory Visit (HOSPITAL_COMMUNITY)
Admission: RE | Admit: 2017-03-22 | Discharge: 2017-03-22 | Disposition: A | Discharge status: Home / Self Care | Payer: Medicare Other | Source: Ambulatory Visit | Attending: Internal Medicine | Admitting: Internal Medicine

## 2017-03-22 ENCOUNTER — Ambulatory Visit (HOSPITAL_COMMUNITY): Payer: Medicare Other | Admitting: Anesthesiology

## 2017-03-22 DIAGNOSIS — E782 Mixed hyperlipidemia: Secondary | ICD-10-CM | POA: Diagnosis not present

## 2017-03-22 DIAGNOSIS — R1310 Dysphagia, unspecified: Secondary | ICD-10-CM | POA: Diagnosis not present

## 2017-03-22 DIAGNOSIS — E039 Hypothyroidism, unspecified: Secondary | ICD-10-CM | POA: Diagnosis not present

## 2017-03-22 DIAGNOSIS — Z1211 Encounter for screening for malignant neoplasm of colon: Secondary | ICD-10-CM | POA: Insufficient documentation

## 2017-03-22 DIAGNOSIS — I2510 Atherosclerotic heart disease of native coronary artery without angina pectoris: Secondary | ICD-10-CM | POA: Diagnosis not present

## 2017-03-22 DIAGNOSIS — J449 Chronic obstructive pulmonary disease, unspecified: Secondary | ICD-10-CM | POA: Diagnosis not present

## 2017-03-22 DIAGNOSIS — K219 Gastro-esophageal reflux disease without esophagitis: Secondary | ICD-10-CM

## 2017-03-22 DIAGNOSIS — Z79899 Other long term (current) drug therapy: Secondary | ICD-10-CM | POA: Diagnosis not present

## 2017-03-22 DIAGNOSIS — I252 Old myocardial infarction: Secondary | ICD-10-CM | POA: Diagnosis not present

## 2017-03-22 DIAGNOSIS — Z951 Presence of aortocoronary bypass graft: Secondary | ICD-10-CM | POA: Diagnosis not present

## 2017-03-22 DIAGNOSIS — Z87891 Personal history of nicotine dependence: Secondary | ICD-10-CM | POA: Insufficient documentation

## 2017-03-22 DIAGNOSIS — E1151 Type 2 diabetes mellitus with diabetic peripheral angiopathy without gangrene: Secondary | ICD-10-CM | POA: Diagnosis not present

## 2017-03-22 DIAGNOSIS — Z1212 Encounter for screening for malignant neoplasm of rectum: Secondary | ICD-10-CM

## 2017-03-22 DIAGNOSIS — K449 Diaphragmatic hernia without obstruction or gangrene: Secondary | ICD-10-CM | POA: Diagnosis not present

## 2017-03-22 DIAGNOSIS — R1013 Epigastric pain: Secondary | ICD-10-CM | POA: Diagnosis present

## 2017-03-22 DIAGNOSIS — Z7951 Long term (current) use of inhaled steroids: Secondary | ICD-10-CM | POA: Diagnosis not present

## 2017-03-22 DIAGNOSIS — G35 Multiple sclerosis: Secondary | ICD-10-CM | POA: Diagnosis not present

## 2017-03-22 DIAGNOSIS — K5900 Constipation, unspecified: Secondary | ICD-10-CM

## 2017-03-22 DIAGNOSIS — Z7982 Long term (current) use of aspirin: Secondary | ICD-10-CM | POA: Diagnosis not present

## 2017-03-22 HISTORY — PX: MALONEY DILATION: SHX5535

## 2017-03-22 HISTORY — PX: ESOPHAGOGASTRODUODENOSCOPY (EGD) WITH PROPOFOL: SHX5813

## 2017-03-22 HISTORY — PX: COLONOSCOPY WITH PROPOFOL: SHX5780

## 2017-03-22 LAB — GLUCOSE, CAPILLARY
GLUCOSE-CAPILLARY: 85 mg/dL (ref 65–99)
Glucose-Capillary: 84 mg/dL (ref 65–99)

## 2017-03-22 SURGERY — COLONOSCOPY WITH PROPOFOL
Anesthesia: Monitor Anesthesia Care

## 2017-03-22 MED ORDER — PROPOFOL 500 MG/50ML IV EMUL
INTRAVENOUS | Status: DC | PRN
  Administered 2017-03-22: 125 ug/kg/min via INTRAVENOUS

## 2017-03-22 MED ORDER — CHLORHEXIDINE GLUCONATE CLOTH 2 % EX PADS: 6.0000 | MEDICATED_PAD | Freq: Once | CUTANEOUS | Status: DC

## 2017-03-22 MED ORDER — LACTATED RINGERS IV SOLN
INTRAVENOUS | Status: DC
  Administered 2017-03-22: 08:00:00 via INTRAVENOUS

## 2017-03-22 MED ORDER — MIDAZOLAM HCL 2 MG/2ML IJ SOLN
INTRAMUSCULAR | Status: AC
  Filled 2017-03-22: qty 2

## 2017-03-22 MED ORDER — FENTANYL CITRATE (PF) 100 MCG/2ML IJ SOLN
25.0000 ug | Freq: Once | INTRAMUSCULAR | Status: AC
  Administered 2017-03-22: 25 ug via INTRAVENOUS

## 2017-03-22 MED ORDER — FENTANYL CITRATE (PF) 100 MCG/2ML IJ SOLN
INTRAMUSCULAR | Status: AC
  Filled 2017-03-22: qty 2

## 2017-03-22 MED ORDER — MIDAZOLAM HCL 2 MG/2ML IJ SOLN
1.0000 mg | INTRAMUSCULAR | Status: AC
  Administered 2017-03-22: 2 mg via INTRAVENOUS

## 2017-03-22 MED ORDER — LIDOCAINE VISCOUS 2 % MT SOLN
OROMUCOSAL | Status: AC
  Filled 2017-03-22: qty 15

## 2017-03-22 MED ORDER — LIDOCAINE VISCOUS 2 % MT SOLN
15.0000 mL | Freq: Once | OROMUCOSAL | Status: AC
  Administered 2017-03-22: 15 mL via OROMUCOSAL

## 2017-03-22 MED ORDER — STERILE WATER FOR IRRIGATION IR SOLN
Status: DC | PRN
  Administered 2017-03-22: 200 mL

## 2017-03-22 NOTE — Op Note (Signed)
[facility] Patient Name: [name] Procedure Date: 03/22/2017 8:32 AM MRN: [id number] Date of Birth: 1950/12/02 Attending MD: Norvel Richards , MD CSN: [id number] Age: 66 Admit Type: Outpatient Procedure:                Colonoscopy Indications:              Screening for colorectal malignant neoplasm Providers:                Norvel Richards, MD, Rosina Lowenstein, RN, Aram Candela Referring MD:              Medicines:                Propofol per Anesthesia Complications:            No immediate complications. Estimated Blood Loss:     Estimated blood loss: none. Procedure:                Pre-Anesthesia Assessment:                           - Prior to the procedure, a History and Physical                            was performed, and patient medications and                            allergies were reviewed. The patient's tolerance of                            previous anesthesia was also reviewed. The risks                            and benefits of the procedure and the sedation                            options and risks were discussed with the patient.                            All questions were answered, and informed consent                            was obtained. Prior Anticoagulants: The patient has                            taken no previous anticoagulant or antiplatelet                            agents. ASA Grade Assessment: II - A patient with                            mild systemic disease. After reviewing the risks  and benefits, the patient was deemed in                            satisfactory condition to undergo the procedure.                           After obtaining informed consent, the colonoscope                            was passed under direct vision. Throughout the                            procedure, the patient's blood pressure, pulse, and                            oxygen  saturations were monitored continuously. The                            EC-[id number] ([id number]) scope was introduced through                            the and advanced to the the cecum, identified by                            appendiceal orifice and ileocecal valve. The                            quality of the bowel preparation was adequate. The                            ileocecal valve, appendiceal orifice, and rectum                            were photographed. Scope In: 8:36:32 AM Scope Out: 8:47:55 AM Scope Withdrawal Time: 0 hours 6 minutes 28 seconds  Total Procedure Duration: 0 hours 11 minutes 23 seconds  Findings:      The perianal and digital rectal examinations were normal.      The colon (entire examined portion) appeared normal.      No additional abnormalities were found on retroflexion. Impression:               - The entire examined colon is normal.                           - No specimens collected. Moderate Sedation:      Moderate (conscious) sedation was personally administered by an       anesthesia professional. The following parameters were monitored: oxygen       saturation, heart rate, blood pressure, respiratory rate, EKG, adequacy       of pulmonary ventilation, and response to care. Total physician       intraservice time was 24 minutes. Recommendation:           - Patient has a contact number available for  emergencies. The signs and symptoms of potential                            delayed complications were discussed with the                            patient. Return to normal activities tomorrow.                            Written discharge instructions were provided to the                            patient.                           - Resume previous diet.                           - Continue present medications.                           - Await pathology results.                           - Repeat colonoscopy in 10 years  for screening                            purposes.                           - Return to GI clinic PRN. Procedure Code(s):        --- Professional ---                           [phone], Colonoscopy, flexible; diagnostic, including                            collection of specimen(s) by brushing or washing,                            when performed (separate procedure) Diagnosis Code(s):        --- Professional ---                           Z12.11, Encounter for screening for malignant                            neoplasm of colon CPT copyright 2016 American Medical Association. All rights reserved. The codes documented in this report are preliminary and upon coder review may  be revised to meet current compliance requirements. Cristopher Estimable. Kameren Pargas, MD Norvel Richards, MD 03/22/2017 8:56:57 AM This report has been signed electronically. Number of Addenda: 0

## 2017-03-22 NOTE — Anesthesia Preprocedure Evaluation (Signed)
Anesthesia Evaluation  Patient identified by MRN, date of birth, ID band Patient awake    Reviewed: Allergy & Precautions, NPO status , Patient's Chart, lab work & pertinent test results  Airway Mallampati: II  TM Distance: >3 FB     Dental  (+) Edentulous Upper, Edentulous Lower   Pulmonary asthma , COPD, former smoker,    breath sounds clear to auscultation       Cardiovascular + CAD, + Past MI, + Cardiac Stents, + CABG and + Peripheral Vascular Disease   Rhythm:Regular Rate:Normal     Neuro/Psych  Neuromuscular disease    GI/Hepatic GERD  ,  Endo/Other  diabetes, Type 2Hypothyroidism   Renal/GU      Musculoskeletal   Abdominal   Peds  Hematology   Anesthesia Other Findings   Reproductive/Obstetrics                             Anesthesia Physical Anesthesia Plan  ASA: III  Anesthesia Plan: MAC   Post-op Pain Management:    Induction: Intravenous  PONV Risk Score and Plan:   Airway Management Planned: Simple Face Mask  Additional Equipment:   Intra-op Plan:   Post-operative Plan:   Informed Consent: I have reviewed the patients History and Physical, chart, labs and discussed the procedure including the risks, benefits and alternatives for the proposed anesthesia with the patient or authorized representative who has indicated his/her understanding and acceptance.     Plan Discussed with:   Anesthesia Plan Comments:         Anesthesia Quick Evaluation

## 2017-03-22 NOTE — Transfer of Care (Signed)
Immediate Anesthesia Transfer of Care Note  Patient: [name]  Procedure(s) Performed: Procedure(s) with comments: COLONOSCOPY WITH PROPOFOL (N/A) - 8:45am ESOPHAGOGASTRODUODENOSCOPY (EGD) WITH PROPOFOL (N/A) MALONEY DILATION (N/A)  Patient Location: PACU  Anesthesia Type:MAC  Level of Consciousness: awake, alert , oriented and patient cooperative  Airway & Oxygen Therapy: Patient Spontanous Breathing and Patient connected to face mask oxygen  Post-op Assessment: Report given to RN and Post -op Vital signs reviewed and stable  Post vital signs: Reviewed and stable  Last Vitals:  Vitals:   03/22/17 0810 03/22/17 0815  BP: 126/69 (!) 142/87  Pulse:    Resp: 18 16  Temp:    SpO2: 96% 97%    Last Pain:  Vitals:   03/22/17 0751  TempSrc: Oral  PainSc: 7          Complications: No apparent anesthesia complications

## 2017-03-22 NOTE — H&P (Signed)
@LOGO @   Primary Care Physician:  Monico Blitz, MD Primary Gastroenterologist:  Dr. Gala Romney  Pre-Procedure History & Physical: HPI:  [name] is a 66 y.o. female here for evaluation of epigastric pain and screening colonoscopy.  Past Medical History:  Diagnosis Date  . Arthritis   . Asthma   . Chronic hoarseness   . COPD (chronic obstructive pulmonary disease) ([place])   . Coronary atherosclerosis of native coronary artery    BMS circumflex, DES LAD, CABG 2012  . GERD (gastroesophageal reflux disease)   . MI (myocardial infarction) ([place])   . Mixed hyperlipidemia   . MS (multiple sclerosis) ([place])   . Neuromuscular disorder ([place])    MS   dx in 1995  . Osteoporosis   . Subclavian vein stenosis, left    Status post stenting - Dr. Burt Knack  . Type 2 diabetes mellitus ([place])    'dx long time ago.  with healthy living, she has done well...no meds, not elevated HGB A1c)    Past Surgical History:  Procedure Laterality Date  . ABDOMINAL HYSTERECTOMY    . BACK SURGERY     2 rods  . CHOLECYSTECTOMY    . COLONOSCOPY  2007   Normal per PCP  . CORONARY ANGIOPLASTY    . CORONARY ARTERY BYPASS GRAFT  February 2012   LIMA to LAD, SVG to OM, SVG to RCA and PDA  . ESOPHAGOGASTRODUODENOSCOPY  2008   Dr. Gala Romney: normal  . FRACTURE SURGERY     right wrist rod placed  . THYROIDECTOMY N/A 11/18/2015   Procedure: TOTAL THYROIDECTOMY;  Surgeon: Armandina Gemma, MD;  Location: [facility];  Service: General;  Laterality: N/A;  . TONSILLECTOMY    . WRIST SURGERY      Prior to Admission medications   Medication Sig Start Date End Date Taking? Authorizing Provider  aspirin EC 81 MG tablet Take 81 mg by mouth daily.   Yes [provider]  buPROPion (WELLBUTRIN) 75 MG tablet Take 75 mg by mouth daily.  09/23/15  Yes [provider]  Cholecalciferol (VITAMIN D3) 1000 units CAPS Take 1,000 Units by mouth daily.   Yes [provider]  DULoxetine (CYMBALTA) 60 MG capsule Take 60 mg  by mouth at bedtime.   Yes [provider]  gabapentin (NEURONTIN) 600 MG tablet Take 600 mg by mouth at bedtime.    Yes [provider]  HYDROcodone-acetaminophen (NORCO) 7.5-325 MG tablet Take 1 tablet by mouth every 6 (six) hours as needed for moderate pain.  09/16/15  Yes [provider]  levothyroxine (SYNTHROID, LEVOTHROID) 125 MCG tablet Take 125 mcg by mouth daily before breakfast.   Yes [provider]  lubiprostone (AMITIZA) 8 MCG capsule Take 1 capsule (8 mcg total) by mouth 2 (two) times daily with a meal. 02/01/17  Yes Mahala Menghini, PA-C  metoprolol tartrate (LOPRESSOR) 25 MG tablet Take 25 mg by mouth 2 (two) times daily.   Yes [provider]  pantoprazole (PROTONIX) 40 MG tablet Take 40 mg by mouth 2 (two) times daily.   Yes [provider]  polyethylene glycol-electrolytes (TRILYTE) 420 g solution Take 4,000 mLs by mouth as directed. 02/02/17  Yes Leata Dominy, Cristopher Estimable, MD  PROAIR HFA 108 [phone] Base) MCG/ACT inhaler Take 2 puffs by mouth every 4 (four) hours as needed for wheezing or shortness of breath.  07/30/15  Yes [provider]  simvastatin (ZOCOR) 20 MG tablet Take 20 mg by mouth every evening.   Yes  [provider]  SYMBICORT 160-4.5 MCG/ACT inhaler Inhale 2 puffs into the lungs 2 (two) times daily.  11/09/16  Yes [provider]  traZODone (DESYREL) 150 MG tablet Take 150 mg by mouth at bedtime.   Yes [provider]  vitamin B-12 (CYANOCOBALAMIN) 500 MCG tablet Take 1,000 mcg by mouth daily.   Yes [provider]  nitroGLYCERIN (NITROSTAT) 0.4 MG SL tablet Place 0.4 mg under the tongue every 5 (five) minutes as needed for chest pain.     [provider]    Allergies as of 02/02/2017 - Review Complete 02/01/2017  Allergen Reaction Noted  . Morphine sulfate Other (See Comments) 01/17/2008  . Zolpidem tartrate Other (See Comments) 01/17/2008  . Sulfa antibiotics Nausea And  Vomiting 11/24/2012    Family History  Problem Relation Age of Onset  . Pancreatic cancer Mother   . Prostate cancer Father   . Colon cancer Neg Hx     Social History   Social History  . Marital status: Married    Spouse name: N/A  . Number of children: N/A  . Years of education: N/A   Occupational History  . Not on file.   Social History Main Topics  . Smoking status: Former Smoker    Packs/day: 0.50    Years: 40.00    Types: Cigarettes    Quit date: 07/06/2016  . Smokeless tobacco: Never Used     Comment: 1/2 pack per day  . Alcohol use No  . Drug use: No  . Sexual activity: Not on file   Other Topics Concern  . Not on file   Social History Narrative  . No narrative on file    Review of Systems: See HPI, otherwise negative ROS  Physical Exam: BP 140/65   Pulse (!) 55   Temp 98 F (36.7 C) (Oral)   Resp 16   SpO2 94%  General:   Alert, , pleasant and cooperative in NAD Neck:  Supple; no masses or thyromegaly. No significant cervical adenopathy. Lungs:  Clear throughout to auscultation.   No wheezes, crackles, or rhonchi. No acute distress. Heart:  Regular rate and rhythm; no murmurs, clicks, rubs,  or gallops. Abdomen: Non-distended, normal bowel sounds.  Soft and nontender without appreciable mass or hepatosplenomegaly.  Pulses:  Normal pulses noted. Extremities:  Without clubbing or edema.  Impression:  Pleasant 66 year old lady for evaluation epigastric pain, GERD and esophageal dysphagia. and screening colonoscopy.  Recommendations:  I have offered the patient EGD with esophageal dilation and colonoscopy per plan today. The risks, benefits, limitations, imponderables and alternatives regarding both EGD and colonoscopy have been reviewed with the patient. Questions have been answered. All parties agreeable.    Notice: This dictation was prepared with Dragon dictation along with smaller phrase technology. Any transcriptional errors that result from  this process are unintentional and may not be corrected upon review.

## 2017-03-22 NOTE — Anesthesia Procedure Notes (Signed)
Procedure Name: MAC Date/Time: 03/22/2017 8:21 AM Performed by: Andree Elk, Jacia Sickman A Pre-anesthesia Checklist: Patient identified, Emergency Drugs available, Suction available and Patient being monitored Oxygen Delivery Method: Simple face mask

## 2017-03-22 NOTE — Anesthesia Postprocedure Evaluation (Signed)
Anesthesia Post Note  Patient: [name]  Procedure(s) Performed: Procedure(s) (LRB): COLONOSCOPY WITH PROPOFOL (N/A) ESOPHAGOGASTRODUODENOSCOPY (EGD) WITH PROPOFOL (N/A) MALONEY DILATION (N/A)  Patient location during evaluation: PACU Anesthesia Type: MAC Level of consciousness: awake and alert, oriented and patient cooperative Pain management: pain level controlled Vital Signs Assessment: post-procedure vital signs reviewed and stable Respiratory status: spontaneous breathing, respiratory function stable and patient connected to face mask oxygen Cardiovascular status: stable Postop Assessment: no signs of nausea or vomiting Anesthetic complications: no     Last Vitals:  Vitals:   03/22/17 0810 03/22/17 0815  BP: 126/69 (!) 142/87  Pulse:    Resp: 18 16  Temp:    SpO2: 96% 97%    Last Pain:  Vitals:   03/22/17 0751  TempSrc: Oral  PainSc: 7                  ADAMS, AMY A

## 2017-03-22 NOTE — Discharge Instructions (Addendum)
°Colonoscopy °Discharge Instructions ° °Read the instructions outlined below and refer to this sheet in the next few weeks. These discharge instructions provide you with general information on caring for yourself after you leave the hospital. Your doctor may also give you specific instructions. While your treatment has been planned according to the most current medical practices available, unavoidable complications occasionally occur. If you have any problems or questions after discharge, call Dr. Rourk at [phone]. °ACTIVITY °· You may resume your regular activity, but move at a slower pace for the next 24 hours.  °· Take frequent rest periods for the next 24 hours.  °· Walking will help get rid of the air and reduce the bloated feeling in your belly (abdomen).  °· No driving for 24 hours (because of the medicine (anesthesia) used during the test).   °· Do not sign any important legal documents or operate any machinery for 24 hours (because of the anesthesia used during the test).  °NUTRITION °· Drink plenty of fluids.  °· You may resume your normal diet as instructed by your doctor.  °· Begin with a light meal and progress to your normal diet. Heavy or fried foods are harder to digest and may make you feel sick to your stomach (nauseated).  °· Avoid alcoholic beverages for 24 hours or as instructed.  °MEDICATIONS °· You may resume your normal medications unless your doctor tells you otherwise.  °WHAT YOU CAN EXPECT TODAY °· Some feelings of bloating in the abdomen.  °· Passage of more gas than usual.  °· Spotting of blood in your stool or on the toilet paper.  °IF YOU HAD POLYPS REMOVED DURING THE COLONOSCOPY: °· No aspirin products for 7 days or as instructed.  °· No alcohol for 7 days or as instructed.  °· Eat a soft diet for the next 24 hours.  °FINDING OUT THE RESULTS OF YOUR TEST °Not all test results are available during your visit. If your test results are not back during the visit, make an appointment  with your caregiver to find out the results. Do not assume everything is normal if you have not heard from your caregiver or the medical facility. It is important for you to follow up on all of your test results.  °SEEK IMMEDIATE MEDICAL ATTENTION IF: °· You have more than a spotting of blood in your stool.  °· Your belly is swollen (abdominal distention).  °· You are nauseated or vomiting.  °· You have a temperature over 101.  °· You have abdominal pain or discomfort that is severe or gets worse throughout the day.  °EGD °Discharge instructions °Please read the instructions outlined below and refer to this sheet in the next few weeks. These discharge instructions provide you with general information on caring for yourself after you leave the hospital. Your doctor may also give you specific instructions. While your treatment has been planned according to the most current medical practices available, unavoidable complications occasionally occur. If you have any problems or questions after discharge, please call your doctor. °ACTIVITY °· You may resume your regular activity but move at a slower pace for the next 24 hours.  °· Take frequent rest periods for the next 24 hours.  °· Walking will help expel (get rid of) the air and reduce the bloated feeling in your abdomen.  °· No driving for 24 hours (because of the anesthesia (medicine) used during the test).  °· You may shower.  °· Do not sign any important   legal documents or operate any machinery for 24 hours (because of the anesthesia used during the test).  NUTRITION  Drink plenty of fluids.   You may resume your normal diet.   Begin with a light meal and progress to your normal diet.   Avoid alcoholic beverages for 24 hours or as instructed by your caregiver.  MEDICATIONS  You may resume your normal medications unless your caregiver tells you otherwise.  WHAT YOU CAN EXPECT TODAY  You may experience abdominal discomfort such as a feeling of fullness  or gas pains.  FOLLOW-UP  Your doctor will discuss the results of your test with you.  SEEK IMMEDIATE MEDICAL ATTENTION IF ANY OF THE FOLLOWING OCCUR:  Excessive nausea (feeling sick to your stomach) and/or vomiting.   Severe abdominal pain and distention (swelling).   Trouble swallowing.   Temperature over 101 F (37.8 C).   Rectal bleeding or vomiting of blood.    Continue Protonix 40 mg daily.  Repeat screening colonoscopy in 10 years  Return to the office in 2 months

## 2017-03-22 NOTE — Op Note (Signed)
[facility] Patient Name: [name] Procedure Date: 03/22/2017 8:07 AM MRN: [id number] Date of Birth: 09/24/1950 Attending MD: Norvel Richards , MD CSN: [id number] Age: 66 Admit Type: Outpatient Procedure:                Upper GI endoscopy Indications:              Dysphagia Providers:                Norvel Richards, MD, Rosina Lowenstein, RN, Aram Candela Referring MD:              Medicines:                Propofol per Anesthesia Complications:            No immediate complications. Estimated Blood Loss:     Estimated blood loss: none. Procedure:                Pre-Anesthesia Assessment:                           - Prior to the procedure, a History and Physical                            was performed, and patient medications and                            allergies were reviewed. The patient's tolerance of                            previous anesthesia was also reviewed. The risks                            and benefits of the procedure and the sedation                            options and risks were discussed with the patient.                            All questions were answered, and informed consent                            was obtained. Prior Anticoagulants: The patient has                            taken no previous anticoagulant or antiplatelet                            agents. ASA Grade Assessment: II - A patient with                            mild systemic disease. After reviewing the risks  and benefits, the patient was deemed in                            satisfactory condition to undergo the procedure.                           After obtaining informed consent, the endoscope was                            passed under direct vision. Throughout the                            procedure, the patient's blood pressure, pulse, and                            oxygen saturations were monitored  continuously. The                            EG-[id number] ([id number]) scope was introduced through the                            and advanced to the second part of duodenum. The                            upper GI endoscopy was accomplished without                            difficulty. The patient tolerated the procedure                            well. Scope In: 8:27:42 AM Scope Out: 8:31:59 AM Total Procedure Duration: 0 hours 4 minutes 17 seconds  Findings:      The examined esophagus was normal.      A small hiatal hernia was present.      The exam was otherwise without abnormality.      The duodenal bulb and second portion of the duodenum were normal. The       scope was withdrawn. Dilation was performed with a Maloney dilator with       no resistance at 33 Fr. The dilation site was examined following       endoscope reinsertion and showed no change. Estimated blood loss: none. Impression:               - Normal esophagus. Dilated.                           - Small hiatal hernia.                           - The examination was otherwise normal.                           - Normal duodenal bulb and second portion of the                            duodenum.                           -  No specimens collected. Moderate Sedation:      Moderate (conscious) sedation was personally administered by an       anesthesia professional. The following parameters were monitored: oxygen       saturation, heart rate, blood pressure, respiratory rate, EKG, adequacy       of pulmonary ventilation, and response to care. Total physician       intraservice time was 8 minutes. Recommendation:           - Patient has a contact number available for                            emergencies. The signs and symptoms of potential                            delayed complications were discussed with the                            patient. Return to normal activities tomorrow.                            Written  discharge instructions were provided to the                            patient.                           - Resume previous diet.                           - Continue present medications.                           - No repeat upper endoscopy.                           - Return to GI office PRN. Continue Protonix 40 mg                            daily. See colonoscopy report. Procedure Code(s):        --- Professional ---                           [phone], Esophagogastroduodenoscopy, flexible,                            transoral; diagnostic, including collection of                            specimen(s) by brushing or washing, when performed                            (separate procedure)                           17494, Dilation of esophagus, by unguided sound or  bougie, single or multiple passes Diagnosis Code(s):        --- Professional ---                           K44.9, Diaphragmatic hernia without obstruction or                            gangrene                           R13.10, Dysphagia, unspecified CPT copyright 2016 [employer]. All rights reserved. The codes documented in this report are preliminary and upon coder review may  be revised to meet current compliance requirements. Cristopher Estimable. Amalio Loe, MD Norvel Richards, MD 03/22/2017 8:53:38 AM This report has been signed electronically. Number of Addenda: 0

## 2017-03-23 NOTE — Progress Notes (Signed)
Reviewed labs from PCP dated 02/07/2017 White blood cell count 8600, hemoglobin 11.9 normal, platelets 3 and 47,000.   Labs from June 2018, TSH 3.880.  Labs from March 2018, BUN 9, creatinine 0.91, total bilirubin 0.4, alkaline phosphatase 76, AST 16, ALT 13, albumin 4.0, lipase 27.

## 2017-03-24 ENCOUNTER — Encounter (HOSPITAL_COMMUNITY): Payer: Self-pay | Admitting: Internal Medicine
